# Patient Record
Sex: Female | Born: 1985 | Race: White | Hispanic: No | Marital: Married | Smoking: Never smoker
Health system: Southern US, Community
[De-identification: ages and names within clinical notes are randomized; demographics above are authoritative.]

## PROBLEM LIST (undated history)

## (undated) ENCOUNTER — Inpatient Hospital Stay (HOSPITAL_COMMUNITY): Payer: Self-pay

## (undated) DIAGNOSIS — R1010 Upper abdominal pain, unspecified: Secondary | ICD-10-CM

## (undated) DIAGNOSIS — Z9889 Other specified postprocedural states: Secondary | ICD-10-CM

## (undated) DIAGNOSIS — R112 Nausea with vomiting, unspecified: Secondary | ICD-10-CM

## (undated) DIAGNOSIS — M549 Dorsalgia, unspecified: Secondary | ICD-10-CM

## (undated) DIAGNOSIS — R1013 Epigastric pain: Secondary | ICD-10-CM

## (undated) DIAGNOSIS — K8020 Calculus of gallbladder without cholecystitis without obstruction: Secondary | ICD-10-CM

## (undated) HISTORY — PX: TONSILLECTOMY: SUR1361

---

## 2012-10-06 NOTE — L&D Delivery Note (Signed)
Delivery Note  First Stage: Labor onset: 0800 Augmentation : none Analgesia /Anesthesia intrapartum: Nubain 10 mg Sub-Q x 1 AROM at 1253  Second Stage: Complete dilation at 1459 Onset of pushing at 1500 FHR second stage 140 with few small variables Tight - thick hymenal band injected with xylocine 1% prior to crowning - clipped edge of hymenal band with crowning  Delivery of a viable female at 105 by CNM in direct OA position Loose nuchal cord x 1 - reduced over head at birth Cord double clamped after cessation of pulsation, cut by FOB Cord blood sample collected   Third Stage: Placenta delivered [facility] intact with 3 VC @ 1700 Placenta disposition: for disposal Uterine tone boggy - massage with minimal response - pitocin 10 units IM                        firm fundus but LUS remains boggy -clots expressed / Cytotec 800 mcg PR  - effective  Left wide superficial labial laceration identified  perineum intact Anesthesia for repair: 1% xylocaine local Repair 4-0 vicryl subcuticular repair to reapproximate labial tissues  Est. Blood Loss (mL): 400   Complications: none  Mom to postpartum.  Baby to Mom for bonding.  Newborn: Birth Weight: 8 pounds - 13 ounces Apgar Scores: 9-9 Feeding planned: breast  Marlinda Mike CNM, MSN, FACNM 05/27/2013, 5:21 PM

## 2012-10-25 LAB — OB RESULTS CONSOLE RUBELLA ANTIBODY, IGM: Rubella: IMMUNE

## 2012-10-25 LAB — OB RESULTS CONSOLE HEPATITIS B SURFACE ANTIGEN: Hepatitis B Surface Ag: NEGATIVE

## 2012-10-25 LAB — OB RESULTS CONSOLE RPR: RPR: NONREACTIVE

## 2012-10-25 LAB — OB RESULTS CONSOLE GC/CHLAMYDIA: Chlamydia: NEGATIVE

## 2012-11-29 ENCOUNTER — Inpatient Hospital Stay (HOSPITAL_COMMUNITY): Admission: AD | Admit: 2012-11-29 | Payer: Self-pay | Source: Ambulatory Visit | Admitting: Obstetrics and Gynecology

## 2013-05-27 ENCOUNTER — Encounter (HOSPITAL_COMMUNITY): Payer: Self-pay | Admitting: *Deleted

## 2013-05-27 ENCOUNTER — Inpatient Hospital Stay (HOSPITAL_COMMUNITY)
Admission: AD | Admit: 2013-05-27 | Discharge: 2013-05-29 | DRG: 775 | Disposition: A | Discharge status: Home / Self Care | Payer: 59 | Source: Ambulatory Visit | Attending: Obstetrics & Gynecology | Admitting: Obstetrics & Gynecology

## 2013-05-27 DIAGNOSIS — K649 Unspecified hemorrhoids: Secondary | ICD-10-CM | POA: Diagnosis present

## 2013-05-27 DIAGNOSIS — O878 Other venous complications in the puerperium: Principal | ICD-10-CM | POA: Diagnosis present

## 2013-05-27 DIAGNOSIS — N896 Tight hymenal ring: Secondary | ICD-10-CM | POA: Diagnosis present

## 2013-05-27 MED ORDER — SIMETHICONE 80 MG PO CHEW: 80.0000 mg | CHEWABLE_TABLET | ORAL | Status: DC | PRN

## 2013-05-27 MED ORDER — ERYTHROMYCIN 5 MG/GM OP OINT
TOPICAL_OINTMENT | OPHTHALMIC | Status: AC
  Filled 2013-05-27: qty 1

## 2013-05-27 MED ORDER — IBUPROFEN 600 MG PO TABS
600.0000 mg | ORAL_TABLET | Freq: Four times a day (QID) | ORAL | Status: DC | PRN
  Administered 2013-05-27: 600 mg via ORAL
  Filled 2013-05-27: qty 1

## 2013-05-27 MED ORDER — MISOPROSTOL 200 MCG PO TABS
800.0000 ug | ORAL_TABLET | Freq: Once | ORAL | Status: AC
  Administered 2013-05-27: 800 ug via RECTAL

## 2013-05-27 MED ORDER — LANOLIN HYDROUS EX OINT: TOPICAL_OINTMENT | CUTANEOUS | Status: DC | PRN

## 2013-05-27 MED ORDER — OXYTOCIN 10 UNIT/ML IJ SOLN
10.0000 [IU] | Freq: Once | INTRAMUSCULAR | Status: AC
  Administered 2013-05-27: 10 [IU] via INTRAMUSCULAR

## 2013-05-27 MED ORDER — NALBUPHINE SYRINGE 5 MG/0.5 ML
INJECTION | INTRAMUSCULAR | Status: AC
  Filled 2013-05-27: qty 1

## 2013-05-27 MED ORDER — OXYCODONE-ACETAMINOPHEN 5-325 MG PO TABS: 1.0000 | ORAL_TABLET | ORAL | Status: DC | PRN

## 2013-05-27 MED ORDER — NALBUPHINE HCL 10 MG/ML IJ SOLN
10.0000 mg | INTRAMUSCULAR | Status: DC | PRN
  Administered 2013-05-27: 10 mg via SUBCUTANEOUS
  Filled 2013-05-27: qty 1

## 2013-05-27 MED ORDER — IBUPROFEN 600 MG PO TABS
600.0000 mg | ORAL_TABLET | Freq: Four times a day (QID) | ORAL | Status: DC
  Administered 2013-05-27 – 2013-05-29 (×6): 600 mg via ORAL
  Filled 2013-05-27 (×6): qty 1

## 2013-05-27 MED ORDER — DIBUCAINE 1 % RE OINT: 1.0000 "application " | TOPICAL_OINTMENT | RECTAL | Status: DC | PRN

## 2013-05-27 MED ORDER — HYDROCORTISONE 2.5 % RE CREA
TOPICAL_CREAM | Freq: Three times a day (TID) | RECTAL | Status: DC
  Administered 2013-05-28 (×3): via RECTAL
  Filled 2013-05-27: qty 28.35

## 2013-05-27 MED ORDER — OXYTOCIN 10 UNIT/ML IJ SOLN
INTRAMUSCULAR | Status: AC
  Filled 2013-05-27: qty 2

## 2013-05-27 MED ORDER — WITCH HAZEL-GLYCERIN EX PADS: 1.0000 "application " | MEDICATED_PAD | CUTANEOUS | Status: DC | PRN

## 2013-05-27 MED ORDER — DIPHENHYDRAMINE HCL 25 MG PO CAPS: 25.0000 mg | ORAL_CAPSULE | Freq: Four times a day (QID) | ORAL | Status: DC | PRN

## 2013-05-27 MED ORDER — LACTATED RINGERS IV SOLN: 500.0000 mL | INTRAVENOUS | Status: DC | PRN

## 2013-05-27 MED ORDER — PANTOPRAZOLE SODIUM 20 MG PO TBEC
20.0000 mg | DELAYED_RELEASE_TABLET | Freq: Every day | ORAL | Status: DC
  Filled 2013-05-27 (×2): qty 1

## 2013-05-27 MED ORDER — CITRIC ACID-SODIUM CITRATE 334-500 MG/5ML PO SOLN: 30.0000 mL | ORAL | Status: DC | PRN

## 2013-05-27 MED ORDER — SENNOSIDES-DOCUSATE SODIUM 8.6-50 MG PO TABS
2.0000 | ORAL_TABLET | Freq: Every day | ORAL | Status: DC
  Administered 2013-05-27 – 2013-05-28 (×2): 2 via ORAL

## 2013-05-27 MED ORDER — MISOPROSTOL 200 MCG PO TABS
ORAL_TABLET | ORAL | Status: AC
  Filled 2013-05-27: qty 4

## 2013-05-27 MED ORDER — LIDOCAINE HCL (PF) 1 % IJ SOLN
INTRAMUSCULAR | Status: AC
  Filled 2013-05-27: qty 30

## 2013-05-27 MED ORDER — LIDOCAINE HCL (PF) 1 % IJ SOLN
30.0000 mL | INTRAMUSCULAR | Status: DC | PRN
  Administered 2013-05-27: 30 mL via SUBCUTANEOUS
  Filled 2013-05-27: qty 30

## 2013-05-27 MED ORDER — BENZOCAINE-MENTHOL 20-0.5 % EX AERO
1.0000 "application " | INHALATION_SPRAY | CUTANEOUS | Status: DC | PRN
  Administered 2013-05-27: 1 via TOPICAL
  Filled 2013-05-27: qty 56

## 2013-05-27 MED ORDER — ACETAMINOPHEN 325 MG PO TABS: 650.0000 mg | ORAL_TABLET | ORAL | Status: DC | PRN

## 2013-05-27 MED ORDER — PRENATAL MULTIVITAMIN CH
1.0000 | ORAL_TABLET | Freq: Every day | ORAL | Status: DC
  Filled 2013-05-27: qty 1

## 2013-05-27 NOTE — H&P (Signed)
OB ADMISSION/ HISTORY & PHYSICAL:  Admission Date: 05/27/2013 11:06 AM  Admit Diagnosis: Active Labor  Dana Anderson is a 27 y.o. female presenting for active labor.  Prenatal History: G1P0   EDC : 05/30/2013, by Other Basis  Prenatal care at [facility]  Primary Ob Provider: Marlinda Mike CNM Prenatal course uncomplicated  Prenatal Labs: ABO, Rh: O/Positive/-- (01/20 0000) Antibody: Negative (01/20 0000) Rubella: Immune (01/20 0000)  RPR: Nonreactive (01/20 0000)  HBsAg: Negative (01/20 0000)  HIV: Non-reactive (01/20 0000)  GTT: NL GBS: Negative (07/25 0000)   Medical / Surgical History :  Past medical history: History reviewed. No pertinent past medical history.   Past surgical history:  Past Surgical History  Procedure Laterality Date  . Tonsillectomy      Family History:  Family History  Problem Relation Age of Onset  . Diabetes Mother   . Diabetes Maternal Grandmother   . Diabetes Maternal Grandfather      Social History:  reports that she has never smoked. She does not have any smokeless tobacco history on file. She reports that she does not drink alcohol or use illicit drugs.  Allergies: Review of patient's allergies indicates no known allergies.   Current Medications at time of admission:  Prior to Admission medications   Medication Sig Start Date End Date Taking? Authorizing Provider  calcium carbonate (TUMS - DOSED IN MG ELEMENTAL CALCIUM) 500 MG chewable tablet Chew 1 tablet by mouth daily as needed for heartburn.   Yes Historical Provider, MD  pantoprazole (PROTONIX) 20 MG tablet Take 20 mg by mouth daily.   Yes Historical Provider, MD  Prenatal Vit-Fe Fumarate-FA (PRENATAL MULTIVITAMIN) TABS tablet Take 1 tablet by mouth daily at 12 noon.   Yes Historical Provider, MD    Review of Systems: Active FM onset of ctx @ 0800 currently every 2-3 minutes No LOF  / SROM bloody show present  Physical Exam:   General: alert and  oriented, appears uncomfortable with ctx but calm and relaxed  Heart: RRR Lungs: Clear lung fields Abdomen: Gravid, soft and non-tender, non-distended / uterus: gravid and non-tender Extremities: no edema  Genitalia / VE: 8-9 cm / 90% vtx / 0 station ROT / BBOW   FHR: baseline rate 130 / variability moderate / accelerations + / no decelerations TOCO: 2-4  Assessment: [redacted] weeks gestation active stage of labor FHR category 1   Plan:  Expectant management after AROM - planning natural childbirth Dr Seymour Bars notified of admission / plan of care   Marlinda Mike CNM, MSN, [facility] 05/27/2013, 1300PM

## 2013-05-27 NOTE — Progress Notes (Signed)
Pt firm but deviated to right. Assisted to bathroom and had large gush of blood when got up.  After voiding, fundus firm, still slightly to right but more midline, bleeding WNL. Pt felt slightly dizzy after being up for a while- assisted back to bed and instructed to call for assist when up next.

## 2013-05-27 NOTE — Progress Notes (Signed)
Patient ID: Dana Anderson, female   DOB: June 14, 1986, 27 y.o.   MRN: [id number]  S:  Contractions uncomfortable - tolerating labor well      Declines analgesia at this time - does not plan for epidural  O:  VS: Blood pressure 129/90, pulse 90, temperature 97.4 F (36.3 C), temperature source Oral, resp. rate 20, height 5\' 6"  (1.676 m), weight 76.114 kg (167 lb 12.8 oz).        FHR : baseline 135 / variability moderate / accelerations + / no decelerations        Toco: contractions every 2-4 minutes / moderate-strong        Cervix : 9 cm / 95% / vtx 0 / ROT        Membranes: AROM - large amount thin yellow MSF  A: active labor     FHR category 1  P: expectant management       Nubain 10 sub-q at patient request for pain   Marlinda Mike CNM, MSN, [facility] 05/27/2013, 1:05 PM

## 2013-05-28 LAB — COMPREHENSIVE METABOLIC PANEL
ALT: 19 U/L (ref 0–35)
AST: 68 U/L — ABNORMAL HIGH (ref 0–37)
Albumin: 2.2 g/dL — ABNORMAL LOW (ref 3.5–5.2)
Alkaline Phosphatase: 169 U/L — ABNORMAL HIGH (ref 39–117)
BUN: 12 mg/dL (ref 6–23)
CO2: 21 mEq/L (ref 19–32)
Calcium: 9 mg/dL (ref 8.4–10.5)
Chloride: 102 mEq/L (ref 96–112)
Creatinine, Ser: 0.76 mg/dL (ref 0.50–1.10)
GFR calc Af Amer: >90 mL/min (ref >90)
GFR calc non Af Amer: >90 mL/min (ref >90)
Glucose, Bld: 115 mg/dL — ABNORMAL HIGH (ref 70–99)
Potassium: 3.8 mEq/L (ref 3.5–5.1)
Sodium: 133 mEq/L — ABNORMAL LOW (ref 135–145)
Total Bilirubin: 0.2 mg/dL — ABNORMAL LOW (ref 0.3–1.2)
Total Protein: 5.2 g/dL — ABNORMAL LOW (ref 6.0–8.3)

## 2013-05-28 LAB — RPR: RPR Ser Ql: NONREACTIVE

## 2013-05-28 LAB — CBC
HCT: 29.5 % — ABNORMAL LOW (ref 36.0–46.0)
Hemoglobin: 10.1 g/dL — ABNORMAL LOW (ref 12.0–15.0)
MCH: 28.8 pg (ref 26.0–34.0)
MCHC: 34.2 g/dL (ref 30.0–36.0)
MCV: 84 fL (ref 78.0–100.0)
Platelets: 207 10*3/uL (ref 150–400)
RBC: 3.51 MIL/uL — ABNORMAL LOW (ref 3.87–5.11)
RDW: 13.7 % (ref 11.5–15.5)
WBC: 13.4 10*3/uL — ABNORMAL HIGH (ref 4.0–10.5)

## 2013-05-28 LAB — URIC ACID: Uric Acid, Serum: 5.4 mg/dL (ref 2.4–7.0)

## 2013-05-28 NOTE — Progress Notes (Signed)
PPD 1 SVD  S:  Reports feeling well - would like early DC this PM at 24 hours             Tolerating po/ No nausea or vomiting             Bleeding is moderate             Pain controlled with motrin             Up ad lib / ambulatory / voiding QS  Newborn breast feeding  / Circumcision planned today O:               VS: BP 108/73  Pulse 81  Temp(Src) 98.6 F (37 C) (Oral)  Resp 18  Ht 5\' 6"  (1.676 m)  Wt 76.114 kg (167 lb 12.8 oz)  BMI 27.1 kg/m2  SpO2 98%   LABS:              Recent Labs  05/28/13 0615  WBC 13.4*  HGB 10.1*  PLT 207               Blood type: O/Positive/-- (01/20 0000)  Rubella: Immune (01/20 0000)                     I&O: Intake/Output     08/22 0701 - 08/23 0700 08/23 0701 - 08/24 0700   Blood 400    Total Output 400     Net -400                        Physical Exam:             Alert and oriented X3  Abdomen: soft, non-tender, non-distended              Fundus: firm, non-tender, U-2  Perineum: moderate edema / ice pack in place  Lochia: light  Extremities: no edema, no calf pain or tenderness    A: PPD # 1             edema at perineum / rectal swelling from pushing and rectal dilation -                                                  recommend topical hemorrhoidal cream to reduce swelling and pain    Doing well - stable status             Newborn circ today  P: Routine post partum orders  Consider early dc tonight pending Peds decision to discharge newborn  Marlinda Mike CNM, MSN, [facility] 05/28/2013, 7:51 AM

## 2013-05-29 MED ORDER — IBUPROFEN 600 MG PO TABS: 600.0000 mg | ORAL_TABLET | Freq: Four times a day (QID) | ORAL | Status: DC

## 2013-05-29 MED ORDER — OXYCODONE-ACETAMINOPHEN 5-325 MG PO TABS: 1.0000 | ORAL_TABLET | ORAL | Status: DC | PRN

## 2013-05-29 NOTE — Progress Notes (Signed)
PPD 2 SVD  S:  Reports feeling well - ready to go home             Tolerating po/ No nausea or vomiting             Bleeding is light             Pain controlled with motrin and percocet             Up ad lib / ambulatory / voiding QS  Newborn breast feeding    O:               VS: BP 103/65  Pulse 67  Temp(Src) 97.8 F (36.6 C) (Oral)  Resp 18  Ht 5\' 6"  (1.676 m)  Wt 76.114 kg (167 lb 12.8 oz)  BMI 27.1 kg/m2  SpO2 98%   LABS:              Recent Labs  05/28/13 0615  WBC 13.4*  HGB 10.1*  PLT 207               Blood type: O/Positive/-- (01/20 0000)  Rubella: Immune (01/20 0000)                           Physical Exam:             Alert and oriented X3  Abdomen: soft, non-tender, non-distended              Fundus: firm, non-tender, U-2  Perineum: mild edema  Lochia: light  Extremities: no edema, no calf pain or tenderness    A: PPD # 2   Doing well - stable status  P: Routine post partum orders  DC home - [facility] booklet / instructions reviewed  Marlinda Mike CNM, MSN, [facility] 05/29/2013, 8:52 AM

## 2013-05-29 NOTE — Discharge Summary (Signed)
Obstetric Discharge Summary  Reason for Admission: onset of labor Prenatal Procedures: none Intrapartum Procedures: spontaneous vaginal delivery Postpartum Procedures: none Complications-Operative and Postpartum: none Hemoglobin  Date Value Range Status  05/28/2013 10.1* 12.0 - 15.0 g/dL Final     HCT  Date Value Range Status  05/28/2013 29.5* 36.0 - 46.0 % Final    Physical Exam:  General: alert, cooperative and no distress Lochia: appropriate Uterine Fundus: firm Perineum - intact - small left labial repair intact DVT Evaluation: No evidence of DVT seen on physical exam.  Discharge Diagnoses: Term Pregnancy-delivered  Discharge Information: Date: 05/29/2013 Activity: pelvic rest Diet: routine Medications: PNV, Ibuprofen, Colace and Percocet Condition: stable Instructions: refer to practice specific booklet Discharge to: home Follow-up Information   Follow up with Marlinda Mike, CNM. Schedule an appointment as soon as possible for a visit in 6 weeks.   Specialty:  Obstetrics and Gynecology   Contact information:   [address] [place] [address] [phone]       Newborn Data: Live born female  Birth Weight: 8 lb 13.3 oz (4006 g) APGAR: 9, 9  Home with mother.  Marlinda Mike 05/29/2013, 8:55 AM

## 2013-05-29 NOTE — Discharge Summary (Signed)
Baby: circ done w/o complication

## 2014-08-07 ENCOUNTER — Encounter (HOSPITAL_COMMUNITY): Payer: Self-pay | Admitting: *Deleted

## 2015-03-23 LAB — OB RESULTS CONSOLE ANTIBODY SCREEN: Antibody Screen: NEGATIVE

## 2015-03-23 LAB — OB RESULTS CONSOLE RUBELLA ANTIBODY, IGM: RUBELLA: IMMUNE

## 2015-03-23 LAB — OB RESULTS CONSOLE HEPATITIS B SURFACE ANTIGEN: Hepatitis B Surface Ag: NEGATIVE

## 2015-03-23 LAB — OB RESULTS CONSOLE HIV ANTIBODY (ROUTINE TESTING): HIV: NONREACTIVE

## 2015-03-23 LAB — OB RESULTS CONSOLE RPR: RPR: NONREACTIVE

## 2015-04-05 LAB — OB RESULTS CONSOLE GC/CHLAMYDIA
CHLAMYDIA, DNA PROBE: NEGATIVE
GC PROBE AMP, GENITAL: NEGATIVE

## 2015-05-01 ENCOUNTER — Encounter (HOSPITAL_COMMUNITY): Payer: Self-pay | Admitting: *Deleted

## 2015-05-01 ENCOUNTER — Inpatient Hospital Stay (HOSPITAL_COMMUNITY)
Admission: AD | Admit: 2015-05-01 | Discharge: 2015-05-01 | Disposition: A | Discharge status: Home / Self Care | Payer: BLUE CROSS/BLUE SHIELD | Source: Ambulatory Visit | Attending: Obstetrics & Gynecology | Admitting: Obstetrics & Gynecology

## 2015-05-01 ENCOUNTER — Inpatient Hospital Stay (HOSPITAL_COMMUNITY): Payer: BLUE CROSS/BLUE SHIELD

## 2015-05-01 DIAGNOSIS — O99611 Diseases of the digestive system complicating pregnancy, first trimester: Secondary | ICD-10-CM | POA: Diagnosis not present

## 2015-05-01 DIAGNOSIS — J069 Acute upper respiratory infection, unspecified: Secondary | ICD-10-CM | POA: Diagnosis not present

## 2015-05-01 DIAGNOSIS — R0602 Shortness of breath: Secondary | ICD-10-CM | POA: Diagnosis present

## 2015-05-01 DIAGNOSIS — Z3A13 13 weeks gestation of pregnancy: Secondary | ICD-10-CM | POA: Diagnosis not present

## 2015-05-01 DIAGNOSIS — K219 Gastro-esophageal reflux disease without esophagitis: Secondary | ICD-10-CM | POA: Diagnosis not present

## 2015-05-01 DIAGNOSIS — O99511 Diseases of the respiratory system complicating pregnancy, first trimester: Secondary | ICD-10-CM | POA: Diagnosis not present

## 2015-05-01 LAB — URINALYSIS, ROUTINE W REFLEX MICROSCOPIC
BILIRUBIN URINE: NEGATIVE
Glucose, UA: NEGATIVE mg/dL
Hgb urine dipstick: NEGATIVE
Ketones, ur: NEGATIVE mg/dL
Leukocytes, UA: NEGATIVE
NITRITE: NEGATIVE
Protein, ur: NEGATIVE mg/dL
SPECIFIC GRAVITY, URINE: 1.015 (ref 1.005–1.030)
UROBILINOGEN UA: 0.2 mg/dL (ref 0.0–1.0)
pH: 7 (ref 5.0–8.0)

## 2015-05-01 LAB — CBC WITH DIFFERENTIAL/PLATELET
BASOS ABS: 0 10*3/uL (ref 0.0–0.1)
Basophils Relative: 0 % (ref 0–1)
Eosinophils Absolute: 0 10*3/uL (ref 0.0–0.7)
Eosinophils Relative: 0 % (ref 0–5)
HCT: 32.6 % — ABNORMAL LOW (ref 36.0–46.0)
HEMOGLOBIN: 11.2 g/dL — AB (ref 12.0–15.0)
LYMPHS PCT: 7 % — AB (ref 12–46)
Lymphs Abs: 1 10*3/uL (ref 0.7–4.0)
MCH: 29.6 pg (ref 26.0–34.0)
MCHC: 34.4 g/dL (ref 30.0–36.0)
MCV: 86 fL (ref 78.0–100.0)
MONO ABS: 1 10*3/uL (ref 0.1–1.0)
MONOS PCT: 7 % (ref 3–12)
Neutro Abs: 12 10*3/uL — ABNORMAL HIGH (ref 1.7–7.7)
Neutrophils Relative %: 86 % — ABNORMAL HIGH (ref 43–77)
PLATELETS: 248 10*3/uL (ref 150–400)
RBC: 3.79 MIL/uL — ABNORMAL LOW (ref 3.87–5.11)
RDW: 14.3 % (ref 11.5–15.5)
WBC: 14 10*3/uL — ABNORMAL HIGH (ref 4.0–10.5)

## 2015-05-01 MED ORDER — AZITHROMYCIN 250 MG PO TABS: ORAL_TABLET | ORAL | Status: AC

## 2015-05-01 NOTE — MAU Note (Signed)
Fever of 100.2 around 0730. Took some tylenol at 0630.Reports SHOB starting on Friday. Pressure on both sides in ribs on Friday that she says is not as bad now, but still having some difficulty. Migraine HA started during the night and rates pain 6of10 now.

## 2015-05-01 NOTE — MAU Note (Signed)
Feer of 100.2 around 0730.  Started feeling short of breath on Friday, both sides of her ribs.states is not as bad now, but still having some difficulty. (able to talk without pause, closed mouth breathing when not talking).  Took some tylenol at 0630.  Started getting a HA during the night, ? Migraine.

## 2015-05-01 NOTE — Discharge Instructions (Signed)
Upper Respiratory Infection, Adult An upper respiratory infection (URI) is also sometimes known as the common cold. The upper respiratory tract includes the nose, sinuses, throat, trachea, and bronchi. Bronchi are the airways leading to the lungs. Most people improve within 1 week, but symptoms can last up to 2 weeks. A residual cough may last even longer.  CAUSES Many different viruses can infect the tissues lining the upper respiratory tract. The tissues become irritated and inflamed and often become very moist. Mucus production is also common. A cold is contagious. You can easily spread the virus to others by oral contact. This includes kissing, sharing a glass, coughing, or sneezing. Touching your mouth or nose and then touching a surface, which is then touched by another person, can also spread the virus. SYMPTOMS  Symptoms typically develop 1 to 3 days after you come in contact with a cold virus. Symptoms vary from person to person. They may include:  Runny nose.  Sneezing.  Nasal congestion.  Sinus irritation.  Sore throat.  Loss of voice (laryngitis).  Cough.  Fatigue.  Muscle aches.  Loss of appetite.  Headache.  Low-grade fever. DIAGNOSIS  You might diagnose your own cold based on familiar symptoms, since most people get a cold 2 to 3 times a year. Your caregiver can confirm this based on your exam. Most importantly, your caregiver can check that your symptoms are not due to another disease such as strep throat, sinusitis, pneumonia, asthma, or epiglottitis. Blood tests, throat tests, and X-rays are not necessary to diagnose a common cold, but they may sometimes be helpful in excluding other more serious diseases. Your caregiver will decide if any further tests are required. RISKS AND COMPLICATIONS  You may be at risk for a more severe case of the common cold if you smoke cigarettes, have chronic heart disease (such as heart failure) or lung disease (such as asthma), or if  you have a weakened immune system. The very young and very old are also at risk for more serious infections. Bacterial sinusitis, middle ear infections, and bacterial pneumonia can complicate the common cold. The common cold can worsen asthma and chronic obstructive pulmonary disease (COPD). Sometimes, these complications can require emergency medical care and may be life-threatening. PREVENTION  The best way to protect against getting a cold is to practice good hygiene. Avoid oral or hand contact with people with cold symptoms. Wash your hands often if contact occurs. There is no clear evidence that vitamin C, vitamin E, echinacea, or exercise reduces the chance of developing a cold. However, it is always recommended to get plenty of rest and practice good nutrition. TREATMENT  Treatment is directed at relieving symptoms. There is no cure. Antibiotics are not effective, because the infection is caused by a virus, not by bacteria. Treatment may include:  Increased fluid intake. Sports drinks offer valuable electrolytes, sugars, and fluids.  Breathing heated mist or steam (vaporizer or shower).  Eating chicken soup or other clear broths, and maintaining good nutrition.  Getting plenty of rest.  Using gargles or lozenges for comfort.  Controlling fevers with ibuprofen or acetaminophen as directed by your caregiver.  Increasing usage of your inhaler if you have asthma. Zinc gel and zinc lozenges, taken in the first 24 hours of the common cold, can shorten the duration and lessen the severity of symptoms. Pain medicines may help with fever, muscle aches, and throat pain. A variety of non-prescription medicines are available to treat congestion and runny nose. Your caregiver   can make recommendations and may suggest nasal or lung inhalers for other symptoms.  HOME CARE INSTRUCTIONS   Only take over-the-counter or prescription medicines for pain, discomfort, or fever as directed by your  caregiver.  Use a warm mist humidifier or inhale steam from a shower to increase air moisture. This may keep secretions moist and make it easier to breathe.  Drink enough water and fluids to keep your urine clear or pale yellow.  Rest as needed.  Return to work when your temperature has returned to normal or as your caregiver advises. You may need to stay home longer to avoid infecting others. You can also use a face mask and careful hand washing to prevent spread of the virus. SEEK MEDICAL CARE IF:   After the first few days, you feel you are getting worse rather than better.  You need your caregiver's advice about medicines to control symptoms.  You develop chills, worsening shortness of breath, or brown or red sputum. These may be signs of pneumonia.  You develop yellow or brown nasal discharge or pain in the face, especially when you bend forward. These may be signs of sinusitis.  You develop a fever, swollen neck glands, pain with swallowing, or white areas in the back of your throat. These may be signs of strep throat. SEEK IMMEDIATE MEDICAL CARE IF:   You have a fever.  You develop severe or persistent headache, ear pain, sinus pain, or chest pain.  You develop wheezing, a prolonged cough, cough up blood, or have a change in your usual mucus (if you have chronic lung disease).  You develop sore muscles or a stiff neck. Document Released: 03/18/2001 Document Revised: 12/15/2011 Document Reviewed: 12/28/2013 ExitCare Patient Information 2015 [employer]. This information is not intended to replace advice given to you by your health care provider. Make sure you discuss any questions you have with your health care provider.  

## 2015-05-01 NOTE — MAU Provider Note (Signed)
History     CSN: [id number]  Arrival date and time: 05/01/15 4098   None     Chief Complaint  Patient presents with  . Fever  . Shortness of Breath   HPI Comments: G2P1001 @13 .6 wks c/o SOB that started 4 days ago along with low grade temp and malaise. Reports that SOB subsided the next day then returned at 0100 this am along with HA and temp 100.2. Denies cough or nasal drainage. Spouse had similar sx which resolved and child had cold sx about 1 wk ago. Denies urinary sx. No cramping or VB. Pregnancy complicated by GERD, mild N/V, and LGSIL and recent colpo w/bx.    OB History    Gravida Para Term Preterm AB TAB SAB Ectopic Multiple Living   2 1 1       1       History reviewed. No pertinent past medical history.  Past Surgical History  Procedure Laterality Date  . Tonsillectomy      Family History  Problem Relation Age of Onset  . Diabetes Mother   . Diabetes Maternal Grandmother   . Diabetes Maternal Grandfather     History  Substance Use Topics  . Smoking status: Never Smoker   . Smokeless tobacco: Never Used  . Alcohol Use: Yes     Comment: socially only when not pregnant     Allergies: No Known Allergies  Prescriptions prior to admission  Medication Sig Dispense Refill Last Dose  . acetaminophen (TYLENOL) 325 MG tablet Take 650 mg by mouth every 6 (six) hours as needed.   05/01/2015 at Unknown time  . docusate sodium (COLACE) 100 MG capsule Take 100 mg by mouth daily as needed for mild constipation.   Past Week at Unknown time  . folic acid (FOLVITE) 1 MG tablet Take 1 mg by mouth daily.   04/30/2015 at Unknown time  . Meclizine HCl (BONINE PO) Take 1 tablet by mouth daily as needed (nausea).   Past Week at Unknown time  . Prenatal Vit-Fe Fumarate-FA (PRENATAL MULTIVITAMIN) TABS tablet Take 1 tablet by mouth daily at 12 noon.   04/30/2015 at Unknown time  . pyridOXINE (VITAMIN B-6) 100 MG tablet Take 100 mg by mouth daily.   05/01/2015 at Unknown time  .  ranitidine (ZANTAC) 300 MG tablet Take 300 mg by mouth 2 (two) times daily.   04/30/2015 at Unknown time  . Simethicone (GAS-X PO) Take 2 tablets by mouth daily as needed (for gas x).   Past Week at Unknown time  . [DISCONTINUED] ibuprofen (ADVIL,MOTRIN) 600 MG tablet Take 1 tablet (600 mg total) by mouth every 6 (six) hours. (Patient not taking: Reported on 05/01/2015) 30 tablet 0 Not Taking at Unknown time  . [DISCONTINUED] oxyCODONE-acetaminophen (PERCOCET/ROXICET) 5-325 MG per tablet Take 1 tablet by mouth every 4 (four) hours as needed. (Patient not taking: Reported on 05/01/2015) 15 tablet 0 Not Taking at Unknown time    Review of Systems  Constitutional: Positive for fever and malaise/fatigue.  Eyes: Negative.   Respiratory: Positive for shortness of breath.   Cardiovascular: Negative.   Gastrointestinal: Positive for nausea and vomiting.  Genitourinary: Negative.   Musculoskeletal: Negative.   Skin: Negative.   Neurological: Positive for headaches.  Endo/Heme/Allergies: Negative.   Psychiatric/Behavioral: Negative.    Physical Exam   Blood pressure 121/75, pulse 102, temperature 98.5 F (36.9 C), temperature source Oral, resp. rate 16, SpO2 98 %, unknown if currently breastfeeding.  Physical Exam  Constitutional: She is  oriented to person, place, and time. She appears well-developed and well-nourished.  HENT:  Head: Normocephalic and atraumatic.  Neck: Normal range of motion. Neck supple.  Cardiovascular: Regular rhythm.   tachy  Respiratory: Effort normal and breath sounds normal.  CVAT neg  GI: Soft. Bowel sounds are normal.  Genitourinary:  deferred  Musculoskeletal: Normal range of motion.  Neurological: She is alert and oriented to person, place, and time.  Skin: Skin is warm and dry.  Psychiatric: She has a normal mood and affect.  FHT-155 bpm  CLINICAL DATA: Thirteen weeks pregnant. Fever and shortness of breath.  EXAM: CHEST 2 VIEW  COMPARISON:  None.  FINDINGS: The heart size and mediastinal contours are within normal limits. Both lungs are clear. The visualized skeletal structures are unremarkable.  IMPRESSION: No active cardiopulmonary disease.  Results for HONORE, WIPPERFURTH (MRN [id number]) as of 05/01/2015 11:12  Ref. Range 05/01/2015 10:30  WBC Latest Ref Range: 4.0-10.5 K/uL 14.0 (H)  RBC Latest Ref Range: 3.87-5.11 MIL/uL 3.79 (L)  Hemoglobin Latest Ref Range: 12.0-15.0 g/dL 40.9 (L)  HCT Latest Ref Range: 36.0-46.0 % 32.6 (L)  MCV Latest Ref Range: 78.0-100.0 fL 86.0  MCH Latest Ref Range: 26.0-34.0 pg 29.6  MCHC Latest Ref Range: 30.0-36.0 g/dL 81.1  RDW Latest Ref Range: 11.5-15.5 % 14.3  Platelets Latest Ref Range: 150-400 K/uL 248  Neutrophils Latest Ref Range: 43-77 % 86 (H)  Lymphocytes Latest Ref Range: 12-46 % 7 (L)  Monocytes Relative Latest Ref Range: 3-12 % 7  Eosinophil Latest Ref Range: 0-5 % 0  Basophil Latest Ref Range: 0-1 % 0  NEUT# Latest Ref Range: 1.7-7.7 K/uL 12.0 (H)  Lymphocyte # Latest Ref Range: 0.7-4.0 K/uL 1.0  Monocyte # Latest Ref Range: 0.1-1.0 K/uL 1.0  Eosinophils Absolute Latest Ref Range: 0.0-0.7 K/uL 0.0  Basophils Absolute Latest Ref Range: 0.0-0.1 K/uL 0.0   MAU Course  Procedures CBCd CXR  Assessment and Plan  13.[redacted] weeks gestation Upper respiratory infection  Discharge home Z-pak-take as directed Tylenol  po q6 hrs prn fever or HA Maintain good hydration Rest Call if sx not improved in 3-4 days Follow-up at [facility] in 3 days  Consult with Dr. Juliene Pina on A/P, agrees   [facility], Shawna Orleans, N 05/01/2015, 10:12 AM

## 2015-10-07 NOTE — L&D Delivery Note (Signed)
Delivery Note  First Stage: Labor onset: 2100 Augmentation : 2035 Analgesia Eliezer Lofts intrapartum: Nubain subQ x1 SROM at 2035  Second Stage: Complete dilation at 2352 Onset of pushing at 2355 FHR second stage category 2  Delivery of a viable female at 86 by CNM in ROA position Loose nuchal cord x 3 with tight final 4th wrap -baby delivered and somersaulted out of first wrap then cord unwound off neck Cord double clamped after cessation of pulsation, cut by FOB Cord blood sample collected   Third Stage: Placenta delivered [facility] intact with 3 VC @ 0008 Placenta disposition: hospital disposal Uterine tone firm / bleeding moderate amount with placental separation then small  no laceration identified  Est. Blood Loss (mL): 200  Complications: none  Mom to postpartum.  Baby to Couplet care / Skin to Skin.   Marlinda Mike CNM, MSN, FACNM 10/26/2015, 12:12 AM

## 2015-10-10 LAB — OB RESULTS CONSOLE GBS: GBS: NEGATIVE

## 2015-10-13 ENCOUNTER — Inpatient Hospital Stay (HOSPITAL_COMMUNITY)
Admission: AD | Admit: 2015-10-13 | Discharge: 2015-10-13 | Disposition: A | Discharge status: Home / Self Care | Payer: BLUE CROSS/BLUE SHIELD | Source: Ambulatory Visit | Attending: Obstetrics & Gynecology | Admitting: Obstetrics & Gynecology

## 2015-10-13 ENCOUNTER — Encounter (HOSPITAL_COMMUNITY): Payer: Self-pay | Admitting: *Deleted

## 2015-10-13 DIAGNOSIS — Z3493 Encounter for supervision of normal pregnancy, unspecified, third trimester: Secondary | ICD-10-CM | POA: Diagnosis present

## 2015-10-13 NOTE — Progress Notes (Signed)
Wiliam Ke bailey, CNM notified of patient, cervical exam, contractions and FHT.  Orders for pt to d/c home increase PO hydration, warm tub. Return if symptoms worsen

## 2015-10-13 NOTE — MAU Note (Signed)
Pt c/o contractions since 430pm. Around 730p, states that they got much more intense and about 3-4 mins apart. Denies LOF and vag bleeding. +FM. Was 2cm on Wed.

## 2015-10-24 ENCOUNTER — Inpatient Hospital Stay (HOSPITAL_COMMUNITY): Admission: AD | Admit: 2015-10-24 | Payer: BLUE CROSS/BLUE SHIELD | Source: Home / Self Care

## 2015-10-25 ENCOUNTER — Encounter (HOSPITAL_COMMUNITY): Payer: Self-pay

## 2015-10-25 ENCOUNTER — Inpatient Hospital Stay (HOSPITAL_COMMUNITY)
Admission: AD | Admit: 2015-10-25 | Discharge: 2015-10-27 | DRG: 775 | Disposition: A | Discharge status: Home / Self Care | Payer: BLUE CROSS/BLUE SHIELD | Source: Ambulatory Visit | Attending: Obstetrics and Gynecology | Admitting: Obstetrics and Gynecology

## 2015-10-25 DIAGNOSIS — Z3A39 39 weeks gestation of pregnancy: Secondary | ICD-10-CM | POA: Diagnosis not present

## 2015-10-25 DIAGNOSIS — Z833 Family history of diabetes mellitus: Secondary | ICD-10-CM

## 2015-10-25 MED ORDER — OXYCODONE-ACETAMINOPHEN 5-325 MG PO TABS: 1.0000 | ORAL_TABLET | ORAL | Status: DC | PRN

## 2015-10-25 MED ORDER — ACETAMINOPHEN 325 MG PO TABS: 650.0000 mg | ORAL_TABLET | ORAL | Status: DC | PRN

## 2015-10-25 MED ORDER — NALBUPHINE HCL 10 MG/ML IJ SOLN
10.0000 mg | INTRAMUSCULAR | Status: DC | PRN
  Administered 2015-10-25: 10 mg via SUBCUTANEOUS
  Filled 2015-10-25: qty 1

## 2015-10-25 MED ORDER — OXYCODONE-ACETAMINOPHEN 5-325 MG PO TABS: 2.0000 | ORAL_TABLET | ORAL | Status: DC | PRN

## 2015-10-25 MED ORDER — OXYTOCIN 10 UNIT/ML IJ SOLN
10.0000 [IU] | Freq: Once | INTRAMUSCULAR | Status: AC
  Administered 2015-10-26: 10 [IU] via INTRAMUSCULAR
  Filled 2015-10-25: qty 1

## 2015-10-25 MED ORDER — LACTATED RINGERS IV SOLN: 500.0000 mL | INTRAVENOUS | Status: DC | PRN

## 2015-10-25 MED ORDER — LIDOCAINE HCL (PF) 1 % IJ SOLN
30.0000 mL | INTRAMUSCULAR | Status: DC | PRN
  Filled 2015-10-25: qty 30

## 2015-10-25 MED ORDER — CITRIC ACID-SODIUM CITRATE 334-500 MG/5ML PO SOLN: 30.0000 mL | ORAL | Status: DC | PRN

## 2015-10-25 NOTE — MAU Note (Signed)
Patient presents with PROM at 2045.

## 2015-10-25 NOTE — MAU Note (Signed)
Notified provider that patient came in for PROM at 2045. Contracting ever 3-4 mins and has just started feeling the contractions. Provider said she would be down to see the patient shortly.

## 2015-10-25 NOTE — H&P (Signed)
  OB ADMISSION/ HISTORY & PHYSICAL:  Admission Date: 10/25/2015  8:52 PM  Admit Diagnosis: 39.1 weeks / active labor with SROM  Dana Anderson is a 30 y.o. female presenting for onset of labor.  Prenatal History: G2P1001   EDC : 10/31/2015, Date entered prior to episode creation  Prenatal care at [facility]  Primary Ob Provider: Fredric Mare CNM Prenatal course uncomplicated  Prenatal Labs: ABO, Rh:  O positive Antibody:  negative Rubella:   immune RPR:   NR HBsAg:   negative HIV:   NR GTT: normal GBS:   negative  Medical / Surgical History :  Past medical history: History reviewed. No pertinent past medical history.   Past surgical history:  Past Surgical History  Procedure Laterality Date  . Tonsillectomy      Family History:  Family History  Problem Relation Age of Onset  . Diabetes Mother   . Diabetes Maternal Grandmother   . Diabetes Maternal Grandfather      Social History:  reports that she has never smoked. She has never used smokeless tobacco. She reports that she drinks alcohol. She reports that she does not use illicit drugs.   Allergies: Review of patient's allergies indicates no known allergies.    Current Medications at time of admission:  Prior to Admission medications   Medication Sig Start Date End Date Taking? Authorizing Provider  docusate sodium (COLACE) 100 MG capsule Take 100 mg by mouth daily as needed for mild constipation.   Yes Historical Provider, MD  folic acid (FOLVITE) 1 MG tablet Take 1 mg by mouth daily.   Yes Historical Provider, MD  hydrocortisone cream 1 % Apply 1 application topically daily as needed for itching.   Yes Historical Provider, MD  pantoprazole (PROTONIX) 40 MG tablet Take 40 mg by mouth daily.   Yes Historical Provider, MD  Prenatal Vit-Fe Fumarate-FA (PRENATAL MULTIVITAMIN) TABS tablet Take 1 tablet by mouth daily at 12 noon.   Yes Historical Provider, MD  acetaminophen (TYLENOL) 325 MG tablet  Take 650 mg by mouth every 6 (six) hours as needed. Reported on 10/25/2015    Historical Provider, MD  Simethicone (GAS-X PO) Take 2 tablets by mouth daily as needed (for gas x). Reported on 10/25/2015    Historical Provider, MD   Review of Systems: Active FM onset of ctx @ 2100 currently every 3 minutes LOF  / SROM @ 2000 bloody show absent  Physical Exam:  VS: Blood pressure 130/90, pulse 94, temperature 97.5 F (36.4 C), temperature source Oral, resp. rate 16, unknown if currently breastfeeding.  General: alert and oriented, appears uncomfortable Heart: RRR Lungs: Clear lung fields Abdomen: Gravid, soft and non-tender, non-distended / uterus: gravid Extremities: no edema  Genitalia / VE: Dilation: 5 Exam by:: Marlinda Mike CNM   FHR: baseline rate 130 / variability moderate / accelerations + / no decelerations TOCO: Q 3-24minutes  Assessment: [redacted] weeks gestation active stage of labor FHR category 1   Plan:  Admit Expectant management Anticipate accelerated active phase  Dr Billy Coast notified of admission    Marlinda Mike CNM, MSN, [facility] 10/25/2015, 9:37 PM

## 2015-10-25 NOTE — Progress Notes (Signed)
S:  More pressure / ctx intense       Wants pain med now  O:  VS: Blood pressure 118/83, pulse 90, temperature 98 F (36.7 C), temperature source Oral, resp. rate 18, height  (1.702 m), weight 74.844 kg (165 lb), unknown if currently breastfeeding.        FHR : baseline 125 / variability moderate / accelerations + / few variable decelerations        Toco: contractions every 2 minutes / strong         Cervix : 8cm / 90% / vtx +1        Membranes: clear fluid  A: active labor     FHR category 2  P: expectant management     Nubain 10sub q now     Marlinda Mike CNM, MSN, [facility] 10/25/2015, 11:08 PM

## 2015-10-26 ENCOUNTER — Encounter (HOSPITAL_COMMUNITY): Payer: Self-pay | Admitting: *Deleted

## 2015-10-26 MED ORDER — LANOLIN HYDROUS EX OINT: TOPICAL_OINTMENT | CUTANEOUS | Status: DC | PRN

## 2015-10-26 MED ORDER — IBUPROFEN 600 MG PO TABS
600.0000 mg | ORAL_TABLET | Freq: Four times a day (QID) | ORAL | Status: DC
  Administered 2015-10-26 – 2015-10-27 (×5): 600 mg via ORAL
  Filled 2015-10-26 (×6): qty 1

## 2015-10-26 MED ORDER — BENZOCAINE-MENTHOL 20-0.5 % EX AERO: 1.0000 "application " | INHALATION_SPRAY | CUTANEOUS | Status: DC | PRN

## 2015-10-26 MED ORDER — WITCH HAZEL-GLYCERIN EX PADS: 1.0000 "application " | MEDICATED_PAD | CUTANEOUS | Status: DC | PRN

## 2015-10-26 MED ORDER — ACETAMINOPHEN 325 MG PO TABS: 650.0000 mg | ORAL_TABLET | ORAL | Status: DC | PRN

## 2015-10-26 MED ORDER — SIMETHICONE 80 MG PO CHEW: 80.0000 mg | CHEWABLE_TABLET | ORAL | Status: DC | PRN

## 2015-10-26 MED ORDER — DIBUCAINE 1 % RE OINT: 1.0000 "application " | TOPICAL_OINTMENT | RECTAL | Status: DC | PRN

## 2015-10-26 MED ORDER — SENNOSIDES-DOCUSATE SODIUM 8.6-50 MG PO TABS
2.0000 | ORAL_TABLET | ORAL | Status: DC
  Administered 2015-10-27: 2 via ORAL
  Filled 2015-10-26: qty 2

## 2015-10-26 MED ORDER — DIPHENHYDRAMINE HCL 25 MG PO CAPS: 25.0000 mg | ORAL_CAPSULE | Freq: Four times a day (QID) | ORAL | Status: DC | PRN

## 2015-10-26 NOTE — Progress Notes (Signed)
Interval Note:  S: Feels well. Pain controlled with Motrin.  O: VSS  A: S/p SVD  P: PPD # 0     Continue routine postpartum care.     Desires early discharge tomorrow. 

## 2015-10-26 NOTE — Lactation Note (Signed)
This note was copied from the chart of Dana Sharran Caratachea. Lactation Consultation Note  Mother holding baby STS with visitors in room. Mother states she has viewed colostrum when hand expressing and denies problems w/ latching. Mother asked a question about frozen breastmilk w/ 1st child.  Baby would not take her frozen breastmilk, only fresh. Provided information about checking freezer temp, checking to make sure odors were not being absorbed and  then lipase/scalding. Discussed cluster feeding.  Mom encouraged to feed baby 8-12 times/24 hours and with feeding cues.  Mom made aware of O/P services, breastfeeding support groups, community resources, and our phone # for post-discharge questions.    Patient Name: Dana Anderson [id number] Date: 10/26/2015 Reason for consult: Initial assessment   Maternal Data Has patient been taught Hand Expression?: Yes (states she has and viewed drops) Does the patient have breastfeeding experience prior to this delivery?: Yes  Feeding Feeding Type: Breast Fed Length of feed: 10 min  LATCH Score/Interventions                      Lactation Tools Discussed/Used     Consult Status Consult Status: Follow-up Date: 10/27/15 Follow-up type: In-patient    Dahlia Byes [facility] 10/26/2015, 11:30 AM

## 2015-10-27 MED ORDER — IBUPROFEN 600 MG PO TABS: 600.0000 mg | ORAL_TABLET | Freq: Four times a day (QID) | ORAL | Status: DC

## 2015-10-27 NOTE — Progress Notes (Signed)
Patient ID: Dana Anderson, female   DOB: [birth year], 30 y.o.   MRN: [id number] PPD # 1 SVD  S:  Reports feeling well. Desires early discharge home today.             Tolerating po/ No nausea or vomiting             Bleeding is light             Pain controlled with ibuprofen (OTC)             Up ad lib / ambulatory / voiding without difficulties    Newborn  Information for the patient's newborn:  [name], Downen [[id number]]  female  breast feeding    O:  A & O x 3, in no apparent distress              VS:  Filed Vitals:   10/26/15 0325 10/26/15 0900 10/26/15 1530 10/27/15 0650  BP: 109/66 104/70 106/67 104/67  Pulse: 55 66 66 54  Temp: 97.7 F (36.5 C) 97.6 F (36.4 C) 98.7 F (37.1 C) 98.5 F (36.9 C)  TempSrc: Oral Oral Oral Oral  Resp: Height:      Weight:      SpO2:  97% 98%     LABS: No results for input(s): WBC, HGB, HCT, PLT in the last 72 hours.  Blood type:  O Positive  Rubella: Immune (06/17 0000)   I&O: I/O last 3 completed shifts: In: -  Out: 500 [Urine:300; Blood:200]             Lungs: Clear and unlabored  Heart: regular rate and rhythm / no murmurs  Abdomen: soft, non-tender, non-distended             Fundus: firm, non-tender, U-2  Perineum: intact, no edema  Lochia: minimal  Extremities: No edema, no calf pain or tenderness, No Homans    A/P: PPD # 1  30 y.o., [id number]   Principal Problem:    Postpartum care following vaginal delivery (1/20)    Doing well - stable status  Routine post partum orders  Desires early D/C home today    Arita Miss, Dayna Geurts, M, MSN, CNM 10/27/2015, 8:18 AM

## 2015-10-27 NOTE — Discharge Summary (Signed)
OB Discharge Summary     Patient Name: Dana Anderson DOB: 1986-05-13 MRN: [id number]  Date of admission: 10/25/2015 Delivering MD: Marlinda Mike   Date of discharge: 10/27/2015  Admitting diagnosis: 39WKS WATER BROKE  Intrauterine pregnancy: [redacted]w[redacted]d     Secondary diagnosis:  Principal Problem:   Postpartum care following vaginal delivery (1/20) Active Problems:   Normal labor  Additional problems: none     Discharge diagnosis: Term Pregnancy Delivered                                                                                                Post partum procedures:none  Augmentation: none  Complications: None  Hospital course:  Onset of Labor With Vaginal Delivery     30 y.o. yo G2P2002 at [redacted]w[redacted]d was admitted in Active Labor on 10/25/2015. Patient had an uncomplicated labor course as follows:  Membrane Rupture Time/Date: 8:35 PM ,10/25/2015   Intrapartum Procedures: Episiotomy: None [1]                                         Lacerations:  None [1]  Patient had a delivery of a Viable infant. 10/26/2015  Information for the patient's newborn:  Nevae, Pinnix [[id number]]  Delivery Method: Vaginal, Spontaneous Delivery (Filed from Delivery Summary)    Pateint had an uncomplicated postpartum course.  She is ambulating, tolerating a regular diet, passing flatus, and urinating well. Patient is discharged home in stable condition on 10/27/2015.    Physical exam  Filed Vitals:   10/26/15 0325 10/26/15 0900 10/26/15 1530 10/27/15 0650  BP: 109/66 104/70 106/67 104/67  Pulse: 55 66 66 54  Temp: 97.7 F (36.5 C) 97.6 F (36.4 C) 98.7 F (37.1 C) 98.5 F (36.9 C)  TempSrc: Oral Oral Oral Oral  Resp: Height:      Weight:      SpO2:  97% 98%    General: alert, cooperative and no distress Lochia: appropriate Uterine Fundus: firm, midline, U-2 Perineum: intact DVT Evaluation: No evidence of DVT seen on physical exam. Negative Homan's  sign. No cords or calf tenderness. No significant calf/ankle edema. Labs: Lab Results  Component Value Date   WBC 14.0* 05/01/2015   HGB 11.2* 05/01/2015   HCT 32.6* 05/01/2015   MCV 86.0 05/01/2015   PLT 248 05/01/2015   CMP Latest Ref Rng 05/28/2013  Glucose 70 - 99 mg/dL 782(N)  BUN 6 - 23 mg/dL 12  Creatinine 5.62 - 1.30 mg/dL 8.65  Sodium 784 - 696 mEq/L 133(L)  Potassium 3.5 - 5.1 mEq/L 3.8  Chloride 96 - 112 mEq/L 102  CO2 19 - 32 mEq/L 21  Calcium 8.4 - 10.5 mg/dL 9.0  Total Protein 6.0 - 8.3 g/dL 5.2(L)  Total Bilirubin 0.3 - 1.2 mg/dL 2.9(B)  Alkaline Phos 39 - 117 U/L 169(H)  AST 0 - 37 U/L 68(H)  ALT 0 - 35 U/L 19    Discharge instruction: per After Visit Summary and "Baby  and Me Booklet".  After visit meds:    Medication List    STOP taking these medications        folic acid 1 MG tablet  Commonly known as:  FOLVITE     GAS-X PO     pantoprazole 40 MG tablet  Commonly known as:  PROTONIX      TAKE these medications        acetaminophen 325 MG tablet  Commonly known as:  TYLENOL  Take 650 mg by mouth every 6 (six) hours as needed. Reported on 10/25/2015     docusate sodium 100 MG capsule  Commonly known as:  COLACE  Take 100 mg by mouth daily as needed for mild constipation.     hydrocortisone cream 1 %  Apply 1 application topically daily as needed for itching.     ibuprofen 600 MG tablet  Commonly known as:  ADVIL,MOTRIN  Take 1 tablet (600 mg total) by mouth every 6 (six) hours.     prenatal multivitamin Tabs tablet  Take 1 tablet by mouth daily at 12 noon.        Diet: routine diet  Activity: Advance as tolerated. Pelvic rest for 6 weeks.   Outpatient follow up:6 weeks Follow up Appt:No future appointments. Follow up Visit:No Follow-up on file.  Postpartum contraception: Abstinence  Newborn Data: Live born female on 10/26/2015 Birth Weight: 7 lb 3.7 oz (3280 g) APGAR: 9, 9  Baby Feeding: Breast Disposition:home with  mother   10/27/2015 Raelyn Mora, Judie Petit, CNM

## 2015-10-27 NOTE — Discharge Instructions (Signed)
Breast Pumping Tips If you are breastfeeding, there may be times when you cannot feed your baby directly. Returning to work or going on a trip are examples. Pumping allows you to store breast milk and feed it to your baby later.  You may not get much milk when you first start to pump. Your breasts should start to make more after a few days. If you pump at the times you usually feed your baby, you may be able to keep making enough milk to feed your baby without also using formula. The more often you pump, the more milk your body will make. WHEN SHOULD I PUMP?   You can start to pump soon after you have your baby. Ask your doctor what is right for you and your baby.  If you are going back to work, start pumping a few weeks before. This gives you time to learn how to pump and to store a supply of milk.  When you are with your baby, feed your baby when he or she is hungry. Pump after each feeding.  When you are away from your baby for many hours, pump for about 15 minutes every 2-3 hours. Pump both breasts at the same time if you can.  If your baby has a formula feeding, make sure to pump close to the same time.  If you drink any alcohol, wait 2 hours before pumping. HOW DO I GET READY TO PUMP? Your let-down reflex is your body's natural reaction that makes your breast milk flow. It is easier to make your breast milk flow when you are relaxed. Try these things to help you relax:  Smell one of your baby's blankets or an item of clothing.  Look at a picture or video of your baby.  Sit in a quiet, private space.  Massage the breast you plan to pump.  Place soothing warmth on the breast.  Play relaxing music. WHAT ARE SOME BREAST PUMPING TIPS?  Wash your hands before you pump. You do not need to wash your nipples or breasts.  There are three ways to pump. You can:  Use your hand to massage and squeeze your breast.  Use a handheld manual pump.  Use an electric pump.  Make sure the  suction cup on the breast pump is the right size. Place the suction cup directly over the nipple. It can be painful or hurt your nipple if it is the wrong size or placed wrong.  Put a small amount of purified or modified lanolin on your nipple and areola if you are sore.  If you are using an electric pump, change the speed and suction power to be more comfortable.  You may need a different type of pump if pumping hurts or you do not get a lot of milk. Your doctor can help you pick what type of pump to use.  Keep a full water bottle near you always. Drinking lots of fluid helps you make more milk.  You can store your milk to use later. Pumped breast milk can be stored in a sealable, sterile container or plastic bag. Always put the date you pumped it on the container.  Milk can stay out at room temperature for up to 8 hours.  You can store your milk in the refrigerator for up to 8 days.  You can store your milk in the freezer for 3 months. Thaw frozen milk using warm water. Do not put it in the microwave.  Do not smoke.  Ask your doctor for help. WHEN SHOULD I CALL MY DOCTOR?  You have a hard time pumping.  You are worried you do not make enough milk.  You have nipple pain, soreness, or redness.  You want to take birth control pills.   This information is not intended to replace advice given to you by your health care provider. Make sure you discuss any questions you have with your health care provider.   Document Released: 03/10/2008 Document Revised: 09/27/2013 Document Reviewed: 07/15/2013 Elsevier Interactive Patient Education 2016 [employer]  Home Care Instructions for Mom After discharge, you may discover that you still have questions about body changes, activity, and care during the next few weeks. The following information should be helpful in answering many of your questions. ACTIVITY  Gradually resume your daily activities at home.  Allow time for rest periods  during the day and nap while your newborn sleeps.  Avoid heavy lifting (more than 10 lb [4.5 kg]) and strenuous work or sports. Slow to moderate walking is usually safe.  If you had a cesarean delivery, do not vacuum, climb stairs, or drive a car for 4 to 6 weeks.  If you had a cesarean delivery, make arrangements for help at home until you feel you are okay to do your usual activities yourself.  Ask your caregiver for safe exercises to do after delivery, especially if you had a cesarean delivery. VAGINAL FLOW AND RETURN OF YOUR MENSTRUAL PERIOD  Vaginal flow may continue for 4 to 6 weeks after delivery.  Usually the amount decreases and the color of blood gets lighter.  Bright red blood and an increased flow may reoccur if you have been too active.  Lie down, raise (elevate) your feet, place a cold compress on your lower abdomen, rest, and call your caregiver if you are soaking more than 1 pad an hour or passing large clots.  Menstrual periods will usually return 6 to 8 weeks after delivery.  If you are breastfeeding, your period will return anywhere from 8 weeks to the time you stop breastfeeding. PERINEAL CARE  Use the peri bottle and change pads each time you go to the bathroom.  Use towelettes in place of toilet paper until stitches are healed.  Take warm tub baths for 15 to 20 minutes.  Continue to use medicated pads or pain relieving spray.  Lidocaine cream for episiotomy pain can be used with your caregiver's approval.  Do not use tampons or douches until vaginal bleeding has stopped (about 4 weeks).  Sexual intercourse should be avoided for at least 3 to 4 weeks after delivery or until the brownish-red vaginal flow is completely gone.  Wipe from front to back. INCISION CARE (AFTER A CESAREAN DELIVERY)  Shower as desired. Try to keep your incision dry. BOWELS AND HEMORRHOIDS  Drink at least 6 to 8 glasses of non-caffeinated fluids per day.  Eat fiber-rich diet  with whole grains, raw fruits, and vegetables.  Take frequent warm tub baths if hemorrhoids are a problem.  Avoid straining when having a bowel movement.  Over-the-counter medicines and stool softeners can be used as directed by your caregiver. NUTRITION  Eat a well-balanced diet that includes the basic food groups.  Do not try to lose weight quickly by cutting back on calories.  If you are breastfeeding, drink at least 8 to 10 glasses of non-caffeinated fluid per day and increase your intake by 600 calories a day.  Take your prenatal vitamins until your postpartum checkup  or until your caregiver tells you to stop. BREASTFEEDING If you are not breastfeeding:  Wear a good tight-fitting bra.  Limit fluid intake after 1 or 2 days after delivery, or as directed, if your breasts are becoming engorged.  Avoid nipple stimulation and apply cool (not icy cold) compresses to the breasts for comfort as needed.  Avoid drinking alcohol and caffeinated drinks.  Mild over-the-counter pain medication recommended by your caregiver is helpful for breast discomfort.  Medications to dry up breast milk are not recommended. If you are breastfeeding:  Encourage your newborn to breastfeed if you think he or she is hungry.  Wash your hands before breastfeeding.  Clean your breasts with warm water before nursing.  Start to encourage feeding 8 to 12 times a day for 10 to 15 minutes on each breast in the beginning to help stimulate milk production and train your newborn.  Avoid water and formula supplements for your newborn unless otherwise directed.  Have your newborn seen by his or her caregiver 3 to 5 days after delivery and again at 2 to 3 weeks to evaluate his or her progress with breastfeeding.  Call your newborn's caregiver if you think he or she is not gaining weight or may be losing weight. POSTPARTUM BLUES Following delivery, your body goes through a drastic change in hormone levels. You  may find yourself crying for no apparent reason and unable to cope with all the changes a newborn brings. Get support from your partner and friends. Give yourself time to adjust. If these feelings persist after several weeks, contact your caregiver or other professionals that can help you. Call your local emergency department, go to the emergency room, or get help right away from a relative, friend, or neighbor if you feel you are about to harm yourself, your newborn, or anyone else. EXERCISES Start Kegel exercises right after delivery. You can do it while standing, sitting, or lying down. Tighten your stomach muscles and the muscles surrounding your birth canal. Hold for a few seconds and then relax. Repeat 5 times each time. Make Kegel exercises a part of your daily routine to maintain the muscle tone that supports your vagina, bladder, and bowels. SELF BREAST EXAMINATION  Do breast self-exams at the same time of the month, each month.  Any lump, bump, or discharge should be reported to your caregiver.  Check your breasts, if you are breastfeeding, just after a feeding when your breasts are less full. If your period has started and you are breastfeeding, check your breasts on the fifth to seventh day of your period.  Breasts are normally lumpy if you are breastfeeding due to the fullness of the milk cells. This is temporary and not a health risk. INTIMACY AND SEXUALITY New parents need time to adjust to each other intimately and sexually after giving birth. Try to spend time as a couple discussing ways to adjust to your infant, your new schedule, and how to meet both your desires and needs. Counseling can be helpful in deeply troubled cases. If you are breastfeeding or not yet having a menstrual period, you can get pregnant. Use some form of birth control to prevent getting pregnant. Talk to your caregiver about the birth control choices that are available to you for you situation. SEEK IMMEDIATE  MEDICAL CARE IF:   Drainage increases from the Cesarean incision, episiotomy or tear site, or the drainage starts to smell bad.  You soak pads with blood in 1 hour or less.  You have severe lower abdominal pain or cramping.  You have a bad smelling vaginal discharge.  You have increased rather than decreased pain around stitches or swelling, redness, or hardness in area.  You have an oral temperature above 102 F (38.9 C), not controlled by medicine.  You have pain or redness in the calf of the leg.  You feel sick to your stomach (nausea) and throw up (vomit) for 12 hours.  You have sudden, severe chest pain.  You have shortness of breath.  You have painful or bloody urination.  You have visual problems.  You develop a severe headache.  An area of your breast is red and sore, and you have a fever. You may feel like you have flu symptoms.   This information is not intended to replace advice given to you by your health care provider. Make sure you discuss any questions you have with your health care provider.   Document Released: 09/19/2000 Document Revised: 12/15/2011 Document Reviewed: 03/26/2015 Elsevier Interactive Patient Education 2016 [employer] Inc[employer]  Vaginal Delivery, Care After Refer to this sheet in the next few weeks. These discharge instructions provide you with information on caring for yourself after delivery. Your health care provider may also give you specific instructions. Your treatment has been planned according to the most current medical practices available, but problems sometimes occur. Call your health care provider if you have any problems or questions after you go home. HOME CARE INSTRUCTIONS  Take over-the-counter or prescription medicines only as directed by your health care provider or pharmacist.  Do not drink alcohol, especially if you are breastfeeding or taking medicine to relieve pain.  Do not chew or smoke tobacco.  Do not use illegal  drugs.  Continue to use good perineal care. Good perineal care includes:  Wiping your perineum from front to back.  Keeping your perineum clean.  Do not use tampons or douche until your health care provider says it is okay.  Shower, wash your hair, and take tub baths as directed by your health care provider.  Wear a well-fitting bra that provides breast support.  Eat healthy foods.  Drink enough fluids to keep your urine clear or pale yellow.  Eat high-fiber foods such as whole grain cereals and breads, brown rice, beans, and fresh fruits and vegetables every day. These foods may help prevent or relieve constipation.  Follow your health care provider's recommendations regarding resumption of activities such as climbing stairs, driving, lifting, exercising, or traveling.  Talk to your health care provider about resuming sexual activities. Resumption of sexual activities is dependent upon your risk of infection, your rate of healing, and your comfort and desire to resume sexual activity.  Try to have someone help you with your household activities and your newborn for at least a few days after you leave the hospital.  Rest as much as possible. Try to rest or take a nap when your newborn is sleeping.  Increase your activities gradually.  Keep all of your scheduled postpartum appointments. It is very important to keep your scheduled follow-up appointments. At these appointments, your health care provider will be checking to make sure that you are healing physically and emotionally. SEEK MEDICAL CARE IF:   You are passing large clots from your vagina. Save any clots to show your health care provider.  You have a foul smelling discharge from your vagina.  You have trouble urinating.  You are urinating frequently.  You have pain when you urinate.  You  have a change in your bowel movements.  You have increasing redness, pain, or swelling near your vaginal incision (episiotomy) or  vaginal tear.  You have pus draining from your episiotomy or vaginal tear.  Your episiotomy or vaginal tear is separating.  You have painful, hard, or reddened breasts.  You have a severe headache.  You have blurred vision or see spots.  You feel sad or depressed.  You have thoughts of hurting yourself or your newborn.  You have questions about your care, the care of your newborn, or medicines.  You are dizzy or light-headed.  You have a rash.  You have nausea or vomiting.  You were breastfeeding and have not had a menstrual period within 12 weeks after you stopped breastfeeding.  You are not breastfeeding and have not had a menstrual period by the 12th week after delivery.  You have a fever. SEEK IMMEDIATE MEDICAL CARE IF:   You have persistent pain.  You have chest pain.  You have shortness of breath.  You faint.  You have leg pain.  You have stomach pain.  Your vaginal bleeding saturates two or more sanitary pads in 1 hour.   This information is not intended to replace advice given to you by your health care provider. Make sure you discuss any questions you have with your health care provider.   Document Released: 09/19/2000 Document Revised: 06/13/2015 Document Reviewed: 05/19/2012 Elsevier Interactive Patient Education 2016 [employer]. Postpartum Depression and Baby Blues The postpartum period begins right after the birth of a baby. During this time, there is often a great amount of joy and excitement. It is also a time of many changes in the life of the parents. Regardless of how many times a mother gives birth, each child brings new challenges and dynamics to the family. It is not unusual to have feelings of excitement along with confusing shifts in moods, emotions, and thoughts. All mothers are at risk of developing postpartum depression or the "baby blues." These mood changes can occur right after giving birth, or they may occur many months after giving  birth. The baby blues or postpartum depression can be mild or severe. Additionally, postpartum depression can go away rather quickly, or it can be a long-term condition.  CAUSES Raised hormone levels and the rapid drop in those levels are thought to be a main cause of postpartum depression and the baby blues. A number of hormones change during and after pregnancy. Estrogen and progesterone usually decrease right after the delivery of your baby. The levels of thyroid hormone and various cortisol steroids also rapidly drop. Other factors that play a role in these mood changes include major life events and genetics.  RISK FACTORS If you have any of the following risks for the baby blues or postpartum depression, know what symptoms to watch out for during the postpartum period. Risk factors that may increase the likelihood of getting the baby blues or postpartum depression include:  Having a personal or family history of depression.   Having depression while being pregnant.   Having premenstrual mood issues or mood issues related to oral contraceptives.  Having a lot of life stress.   Having marital conflict.   Lacking a social support network.   Having a baby with special needs.   Having health problems, such as diabetes.  SIGNS AND SYMPTOMS Symptoms of baby blues include:  Brief changes in mood, such as going from extreme happiness to sadness.  Decreased concentration.   Difficulty  sleeping.   Crying spells, tearfulness.   Irritability.   Anxiety.  Symptoms of postpartum depression typically begin within the first month after giving birth. These symptoms include:  Difficulty sleeping or excessive sleepiness.   Marked weight loss.   Agitation.   Feelings of worthlessness.   Lack of interest in activity or food.  Postpartum psychosis is a very serious condition and can be dangerous. Fortunately, it is rare. Displaying any of the following symptoms is cause  for immediate medical attention. Symptoms of postpartum psychosis include:   Hallucinations and delusions.   Bizarre or disorganized behavior.   Confusion or disorientation.  DIAGNOSIS  A diagnosis is made by an evaluation of your symptoms. There are no medical or lab tests that lead to a diagnosis, but there are various questionnaires that a health care provider may use to identify those with the baby blues, postpartum depression, or psychosis. Often, a screening tool called the [place] Postnatal Depression Scale is used to diagnose depression in the postpartum period.  TREATMENT The baby blues usually goes away on its own in 1-2 weeks. Social support is often all that is needed. You will be encouraged to get adequate sleep and rest. Occasionally, you may be given medicines to help you sleep.  Postpartum depression requires treatment because it can last several months or longer if it is not treated. Treatment may include individual or group therapy, medicine, or both to address any social, physiological, and psychological factors that may play a role in the depression. Regular exercise, a healthy diet, rest, and social support may also be strongly recommended.  Postpartum psychosis is more serious and needs treatment right away. Hospitalization is often needed. HOME CARE INSTRUCTIONS  Get as much rest as you can. Nap when the baby sleeps.   Exercise regularly. Some women find yoga and walking to be beneficial.   Eat a balanced and nourishing diet.   Do little things that you enjoy. Have a cup of tea, take a bubble bath, read your favorite magazine, or listen to your favorite music.  Avoid alcohol.   Ask for help with household chores, cooking, grocery shopping, or running errands as needed. Do not try to do everything.   Talk to people close to you about how you are feeling. Get support from your partner, family members, friends, or other new moms.  Try to stay positive in how  you think. Think about the things you are grateful for.   Do not spend a lot of time alone.   Only take over-the-counter or prescription medicine as directed by your health care provider.  Keep all your postpartum appointments.   Let your health care provider know if you have any concerns.  SEEK MEDICAL CARE IF: You are having a reaction to or problems with your medicine. SEEK IMMEDIATE MEDICAL CARE IF:  You have suicidal feelings.   You think you may harm the baby or someone else. MAKE SURE YOU:  Understand these instructions.  Will watch your condition.  Will get help right away if you are not doing well or get worse.   This information is not intended to replace advice given to you by your health care provider. Make sure you discuss any questions you have with your health care provider.   Document Released: 06/26/2004 Document Revised: 09/27/2013 Document Reviewed: 07/04/2013 Elsevier Interactive Patient Education 2016 Elsevier Inc. Breastfeeding and Mastitis Mastitis is inflammation of the breast tissue. It can occur in women who are breastfeeding. This can make  breastfeeding painful. Mastitis will sometimes go away on its own. Your health care provider will help determine if treatment is needed. CAUSES Mastitis is often associated with a blocked milk (lactiferous) duct. This can happen when too much milk builds up in the breast. Causes of excess milk in the breast can include:  Poor latch-on. If your baby is not latched onto the breast properly, she or he may not empty your breast completely while breastfeeding.  Allowing too much time to pass between feedings.  Wearing a bra or other clothing that is too tight. This puts extra pressure on the lactiferous ducts so milk does not flow through them as it should. Mastitis can also be caused by a bacterial infection. Bacteria may enter the breast tissue through cuts or openings in the skin. In women who are breastfeeding,  this may occur because of cracked or irritated skin. Cracks in the skin are often caused when your baby does not latch on properly to the breast. SIGNS AND SYMPTOMS  Swelling, redness, tenderness, and pain in an area of the breast.  Swelling of the glands under the arm on the same side.  Fever may or may not accompany mastitis. If an infection is allowed to progress, a collection of pus (abscess) may develop. DIAGNOSIS  Your health care provider can usually diagnose mastitis based on your symptoms and a physical exam. Tests may be done to help confirm the diagnosis. These may include:  Removal of pus from the breast by applying pressure to the area. This pus can be examined in the lab to determine which bacteria are present. If an abscess has developed, the fluid in the abscess can be removed with a needle. This can also be used to confirm the diagnosis and determine the bacteria present. In most cases, pus will not be present.  Blood tests to determine if your body is fighting a bacterial infection.  Mammogram or ultrasound tests to rule out other problems or diseases. TREATMENT  Mastitis that occurs with breastfeeding will sometimes go away on its own. Your health care provider may choose to wait 24 hours after first seeing you to decide whether a prescription medicine is needed. If your symptoms are worse after 24 hours, your health care provider will likely prescribe an antibiotic medicine to treat the mastitis. He or she will determine which bacteria are most likely causing the infection and will then select an appropriate antibiotic medicine. This is sometimes changed based on the results of tests performed to identify the bacteria, or if there is no response to the antibiotic medicine selected. Antibiotic medicines are usually given by mouth. You may also be given medicine for pain. HOME CARE INSTRUCTIONS  Only take over-the-counter or prescription medicines for pain, fever, or discomfort  as directed by your health care provider.  If your health care provider prescribed an antibiotic medicine, take the medicine as directed. Make sure you finish it even if you start to feel better.  Do not wear a tight or underwire bra. Wear a soft, supportive bra.  Increase your fluid intake, especially if you have a fever.  Continue to empty the breast. Your health care provider can tell you whether this milk is safe for your infant or needs to be thrown out. You may be told to stop nursing until your health care provider thinks it is safe for your baby. Use a breast pump if you are advised to stop nursing.  Keep your nipples clean and dry.  Empty  the first breast completely before going to the other breast. If your baby is not emptying your breasts completely for some reason, use a breast pump to empty your breasts.  If you go back to work, pump your breasts while at work to stay in time with your nursing schedule.  Avoid allowing your breasts to become overly filled with milk (engorged). SEEK MEDICAL CARE IF:  You have pus-like discharge from the breast.  Your symptoms do not improve with the treatment prescribed by your health care provider within 2 days. SEEK IMMEDIATE MEDICAL CARE IF:  Your pain and swelling are getting worse.  You have pain that is not controlled with medicine.  You have a red line extending from the breast toward your armpit.  You have a fever or persistent symptoms for more than 2-3 days.  You have a fever and your symptoms suddenly get worse. MAKE SURE YOU:   Understand these instructions.  Will watch your condition.  Will get help right away if you are not doing well or get worse.   This information is not intended to replace advice given to you by your health care provider. Make sure you discuss any questions you have with your health care provider.   Document Released: 01/17/2005 Document Revised: 09/27/2013 Document Reviewed:  04/28/2013 Elsevier Interactive Patient Education [employer]

## 2015-10-31 ENCOUNTER — Inpatient Hospital Stay (HOSPITAL_COMMUNITY): Admission: AD | Admit: 2015-10-31 | Payer: Self-pay | Source: Ambulatory Visit | Admitting: Obstetrics and Gynecology

## 2015-11-06 ENCOUNTER — Ambulatory Visit (HOSPITAL_COMMUNITY)
Admission: RE | Admit: 2015-11-06 | Discharge: 2015-11-06 | Disposition: A | Discharge status: Home / Self Care | Payer: BLUE CROSS/BLUE SHIELD | Source: Ambulatory Visit | Attending: Certified Nurse Midwife | Admitting: Certified Nurse Midwife

## 2015-11-06 NOTE — Lactation Note (Addendum)
Lactation Consult  Mother's reason for visit:  Infant chokes at the breast Visit Type:  OP Appointment Notes:  [name] is gaining well but is here today because she is having difficulty managing the flow of mom's milk.  She gasps, chokes comes off the breast and then has to catch her breath before resuming BF.  At one point she came off holding her breath and was dusky for a few moments but quickly recovered and continued eating.  A #24 NS was introduced to help manage the flow. Mom reported increased comfort and [name] was less overwhelmed with the milk.  She transferred 34 ml but came off twice related to choking.  She was positioned on the right breast and transferred an additional 22 ml for a total of 56.  She continued rooting and was returned to the left breast.  She suckled a few more moments but did not transfer additional milk. He oral assessment reveals that her mouth closes when she elevates her tongue. She is able to extend it but when her mouth is open there is snapback on a gloved finger.  A thick frenum is noted sublingually. SHe does not flange her upper lip well.  Her jaw was also quivering during the feeding.  Coughing , choking and and jaw quivering are suggestive of oral restriction.  She seems to have difficulty with the suck, swallow, breath pattern .Plan is to BF with the nipple shield after pre-pumping or hand expression to handle the initial MER and follow-up with lactation Feb 8.  Pediatrician appointment later this week.   Consult:  Initial Lactation Consultant:  Soyla Dryer  ________________________________________________________________________   Dana Anderson Name: Dana Anderson Date of Birth: 10/26/2015 Pediatrician: Ramonita Lab Gender: female Gestational Age: [redacted]w[redacted]d (At Birth) Birth Weight: 7 lb 3.7 oz (3280 g) Weight at Discharge: Weight: 6 lb 15.3 oz (3155 g)Date of Discharge: 10/27/2015 [facility] Weights   10/26/15 0003 10/27/15 0010   Weight: 7 lb 3.7 oz (3280 g) 6 lb 15.3 oz (3155 g)   Last weight taken from location outside of [facility]: 6+15 oz Location:Pediatrician's office Weight today: 7 # 9oz     ________________________________________________________________________  Mother's Name: Dana Anderson  Maternal Medical Conditions:  NA Maternal Medications:  Folic acid and prenatal  ________________________________________________________________________  Infant Intake and Output Assessment  Voids:  6 in 24 hrs.  Color:  Clear yellow Stools:  3+ in 24 hrs.  Color:  Yellow  ________________________________________________________________________

## 2015-11-14 ENCOUNTER — Ambulatory Visit (HOSPITAL_COMMUNITY)
Admission: RE | Admit: 2015-11-14 | Discharge: 2015-11-14 | Disposition: A | Discharge status: Home / Self Care | Payer: BLUE CROSS/BLUE SHIELD | Source: Ambulatory Visit | Attending: Certified Nurse Midwife | Admitting: Certified Nurse Midwife

## 2015-11-14 NOTE — Lactation Note (Signed)
Lactation Consult  Mother's reason for visit: feeding assessment Visit Type:  Outpatient Consult:  Follow-Up Lactation Consultant:  Hardie Pulley  ________________________________________________________________________  Dana Anderson Name: Dana Anderson Date of Birth: 10/26/2015 Pediatrician: Victorino Dike Summer  Gender: female Gestational Age: [redacted]w[redacted]d (At Birth) Birth Weight: 7 lb 3.7 oz (3280 g) Weight at Discharge: Weight: 6 lb 15.3 oz (3155 g)Date of Discharge: 10/27/2015 [facility] Weights   10/26/15 0003 10/27/15 0010  Weight: 7 lb 3.7 oz (3280 g) 6 lb 15.3 oz (3155 g)   Last weight taken from location outside of [facility]:7 lb 9 oz. 2/3 Location:Pediatrician's office Weight today: 8 lb 1.8 oz  Mother has had a strong let down reflex when breastfeeding where baby gags and pulls off breast frequently.  A #24 nipple shield was introduced at the last OP appointment to slow flow.  Mother states latching has improved with the exception of night time feedings.  She still seems to pull off and on breast to control the strong let down reflex.  Mother breastfed baby on both breasts during consult but has only been breastfeeding on one breast prior to visit.  No gagging, choking or pulling off observed.  Sucks and swallows observed.  Mother has been pumping 6 x a day.  Suggest reducing pumping to 3-4x a day for 10-15 min.  Breastfeed on both breasts per feeding if baby's desires. Consider pumping as an alternative to breastfeeding during the night until less fussiness at breast is observed and to reduce feeding stress during the night.  Refer to weight gain chart so mother will feel more confident about baby's weight gain.  Suggest she attend support group for weight check and answer further questions.  Once baby adjust to flow and volume mother can start to breastfeed without nipple shield and eliminate pumping as long as weight is in acceptable range.   _______________________________________________________________________  Mother's Name: Dana Anderson Type of delivery:   Breastfeeding Experience:  Multitip ________________________________________________________________________  Breastfeeding History (Post Discharge)  Frequency of breastfeeding:  Every 2-3 hours Duration of feeding:  30-45 min  Infant Intake and Output Assessment  Voids:  7 in 24 hrs.  Color:  Clear yellow Stools:  3  in 24 hrs.  Color:  Yellow  ________________________________________________________________________  Maternal Breast Assessment  Breast:  Full Nipple:  Erect Pain level:  0 _______________________________________________________________________ Feeding Assessment/Evaluation  Initial feeding assessment:  Infant's oral assessment:  WNL  Positioning:  Football Left breast  LATCH documentation:  Latch:  2 = Grasps breast easily, tongue down, lips flanged, rhythmical sucking.  Audible swallowing:  2 = Spontaneous and intermittent  Type of nipple:  2 = Everted at rest and after stimulation  Comfort (Breast/Nipple):  2 = Soft / non-tender  Hold (Positioning):  2 = No assistance needed to correctly position infant at breast  LATCH score:  10  Attached assessment:  Deep  Lips flanged:  Yes.    Lips untucked:  Yes.    Suck assessment:  Displays both  Tools:  Nipple shield 24 mm Instructed on use and cleaning of tool:  Yes.    Pre-feed weight:  3624 g  Post-feed weight:  3670 g  Amount transferred:  46 ml Additional Feeding Assessment -   Infant's oral assessment:  WNL  Positioning:  Football Right breast  LATCH documentation:  Latch:  2 = Grasps breast easily, tongue down, lips flanged, rhythmical sucking.  Audible swallowing:  2 = Spontaneous and intermittent  Type of nipple:  2 =  Everted at rest and after stimulation  Comfort (Breast/Nipple):  2 = Soft / non-tender  Hold (Positioning):  2 = No assistance needed to  correctly position infant at breast  LATCH score:  10  Attached assessment:  Deep  Lips flanged:  Yes.    Lips untucked:  Yes.    Suck assessment:  Nonnutritive and Displays both  Tools:  Nipple shield 24 mm Instructed on use and cleaning of tool:  Yes.    Pre-feed weight:  3670 g  Post-feed weight:  3678 g  Amount transferred:  8 ml Total amount pumped post feed:  R  8 ml    L 46 ml  Total amount transferred:  54 ml

## 2015-11-23 ENCOUNTER — Encounter (HOSPITAL_COMMUNITY): Payer: Self-pay | Admitting: *Deleted

## 2015-11-23 ENCOUNTER — Inpatient Hospital Stay (HOSPITAL_COMMUNITY)
Admission: AD | Admit: 2015-11-23 | Discharge: 2015-11-23 | Disposition: A | Discharge status: Home / Self Care | Payer: BLUE CROSS/BLUE SHIELD | Source: Ambulatory Visit | Attending: Obstetrics and Gynecology | Admitting: Obstetrics and Gynecology

## 2015-11-23 ENCOUNTER — Inpatient Hospital Stay (HOSPITAL_COMMUNITY): Payer: BLUE CROSS/BLUE SHIELD

## 2015-11-23 DIAGNOSIS — O9963 Diseases of the digestive system complicating the puerperium: Secondary | ICD-10-CM | POA: Insufficient documentation

## 2015-11-23 DIAGNOSIS — R1013 Epigastric pain: Secondary | ICD-10-CM | POA: Diagnosis present

## 2015-11-23 DIAGNOSIS — R079 Chest pain, unspecified: Secondary | ICD-10-CM

## 2015-11-23 DIAGNOSIS — K5900 Constipation, unspecified: Secondary | ICD-10-CM | POA: Insufficient documentation

## 2015-11-23 DIAGNOSIS — R109 Unspecified abdominal pain: Secondary | ICD-10-CM | POA: Diagnosis present

## 2015-11-23 DIAGNOSIS — R1010 Upper abdominal pain, unspecified: Secondary | ICD-10-CM | POA: Diagnosis not present

## 2015-11-23 DIAGNOSIS — K8020 Calculus of gallbladder without cholecystitis without obstruction: Secondary | ICD-10-CM | POA: Diagnosis not present

## 2015-11-23 DIAGNOSIS — M549 Dorsalgia, unspecified: Secondary | ICD-10-CM | POA: Diagnosis present

## 2015-11-23 HISTORY — DX: Upper abdominal pain, unspecified: R10.10

## 2015-11-23 HISTORY — DX: Epigastric pain: R10.13

## 2015-11-23 HISTORY — DX: Calculus of gallbladder without cholecystitis without obstruction: K80.20

## 2015-11-23 HISTORY — DX: Dorsalgia, unspecified: M54.9

## 2015-11-23 LAB — CBC
HEMATOCRIT: 39 % (ref 36.0–46.0)
Hemoglobin: 13.3 g/dL (ref 12.0–15.0)
MCH: 29.5 pg (ref 26.0–34.0)
MCHC: 34.1 g/dL (ref 30.0–36.0)
MCV: 86.5 fL (ref 78.0–100.0)
Platelets: 322 10*3/uL (ref 150–400)
RBC: 4.51 MIL/uL (ref 3.87–5.11)
RDW: 13.8 % (ref 11.5–15.5)
WBC: 9.8 10*3/uL (ref 4.0–10.5)

## 2015-11-23 LAB — BASIC METABOLIC PANEL
ANION GAP: 10 (ref 5–15)
BUN: 19 mg/dL (ref 6–20)
CALCIUM: 9.1 mg/dL (ref 8.9–10.3)
CO2: 28 mmol/L (ref 22–32)
Chloride: 105 mmol/L (ref 101–111)
Creatinine, Ser: 0.76 mg/dL (ref 0.44–1.00)
Glucose, Bld: 117 mg/dL — ABNORMAL HIGH (ref 65–99)
Potassium: 3.9 mmol/L (ref 3.5–5.1)
SODIUM: 143 mmol/L (ref 135–145)

## 2015-11-23 LAB — COMPREHENSIVE METABOLIC PANEL
ALBUMIN: 4.3 g/dL (ref 3.5–5.0)
ALT: 171 U/L — ABNORMAL HIGH (ref 14–54)
AST: 264 U/L — AB (ref 15–41)
Alkaline Phosphatase: 132 U/L — ABNORMAL HIGH (ref 38–126)
Anion gap: 8 (ref 5–15)
BUN: 19 mg/dL (ref 6–20)
CHLORIDE: 105 mmol/L (ref 101–111)
CO2: 29 mmol/L (ref 22–32)
Calcium: 9.4 mg/dL (ref 8.9–10.3)
Creatinine, Ser: 0.77 mg/dL (ref 0.44–1.00)
GFR calc Af Amer: >60 mL/min (ref >60)
GFR calc non Af Amer: >60 mL/min (ref >60)
GLUCOSE: 116 mg/dL — AB (ref 65–99)
POTASSIUM: 3.9 mmol/L (ref 3.5–5.1)
SODIUM: 142 mmol/L (ref 135–145)
Total Bilirubin: 0.9 mg/dL (ref 0.3–1.2)
Total Protein: 6.8 g/dL (ref 6.5–8.1)

## 2015-11-23 LAB — AMYLASE: Amylase: 51 U/L (ref 28–100)

## 2015-11-23 LAB — LIPASE, BLOOD: Lipase: 34 U/L (ref 11–51)

## 2015-11-23 MED ORDER — IBUPROFEN 800 MG PO TABS
800.0000 mg | ORAL_TABLET | Freq: Once | ORAL | Status: AC
  Administered 2015-11-23: 800 mg via ORAL
  Filled 2015-11-23: qty 1

## 2015-11-23 MED ORDER — GI COCKTAIL ~~LOC~~
30.0000 mL | Freq: Once | ORAL | Status: AC
  Administered 2015-11-23: 30 mL via ORAL
  Filled 2015-11-23: qty 30

## 2015-11-23 MED ORDER — IBUPROFEN 800 MG PO TABS: 400.0000 mg | ORAL_TABLET | Freq: Once | ORAL | Status: DC

## 2015-11-23 NOTE — Discharge Instructions (Signed)
Abdominal Pain, Adult Many things can cause abdominal pain. Usually, abdominal pain is not caused by a disease and will improve without treatment. It can often be observed and treated at home. Your health care provider will do a physical exam and possibly order blood tests and X-rays to help determine the seriousness of your pain. However, in many cases, more time must pass before a clear cause of the pain can be found. Before that point, your health care provider may not know if you need more testing or further treatment. HOME CARE INSTRUCTIONS Monitor your abdominal pain for any changes. The following actions may help to alleviate any discomfort you are experiencing:  Only take over-the-counter or prescription medicines as directed by your health care provider.  Do not take laxatives unless directed to do so by your health care provider.  Try a clear liquid diet (broth, tea, or water) as directed by your health care provider. Slowly move to a bland diet as tolerated. SEEK MEDICAL CARE IF:  You have unexplained abdominal pain.  You have abdominal pain associated with nausea or diarrhea.  You have pain when you urinate or have a bowel movement.  You experience abdominal pain that wakes you in the night.  You have abdominal pain that is worsened or improved by eating food.  You have abdominal pain that is worsened with eating fatty foods.  You have a fever. SEEK IMMEDIATE MEDICAL CARE IF:  Your pain does not go away within 2 hours.  You keep throwing up (vomiting).  Your pain is felt only in portions of the abdomen, such as the right side or the left lower portion of the abdomen.  You pass bloody or black tarry stools. MAKE SURE YOU:  Understand these instructions.  Will watch your condition.  Will get help right away if you are not doing well or get worse.   This information is not intended to replace advice given to you by your health care provider. Make sure you discuss  any questions you have with your health care provider.   Document Released: 07/02/2005 Document Revised: 06/13/2015 Document Reviewed: 06/01/2013 Elsevier Interactive Patient Education 2016 [employer]. Cholelithiasis Cholelithiasis (also called gallstones) is a form of gallbladder disease in which gallstones form in your gallbladder. The gallbladder is an organ that stores bile made in the liver, which helps digest fats. Gallstones begin as small crystals and slowly grow into stones. Gallstone pain occurs when the gallbladder spasms and a gallstone is blocking the duct. Pain can also occur when a stone passes out of the duct.  RISK FACTORS  Being female.   Having multiple pregnancies. Health care providers sometimes advise removing diseased gallbladders before future pregnancies.   Being obese.  Eating a diet heavy in fried foods and fat.   Being older than 60 years and increasing age.   Prolonged use of medicines containing female hormones.   Having diabetes mellitus.   Rapidly losing weight.   Having a family history of gallstones (heredity).  SYMPTOMS  Nausea.   Vomiting.  Abdominal pain.   Yellowing of the skin (jaundice).   Sudden pain. It may persist from several minutes to several hours.  Fever.   Tenderness to the touch. In some cases, when gallstones do not move into the bile duct, people have no pain or symptoms. These are called "silent" gallstones.  TREATMENT Silent gallstones do not need treatment. In severe cases, emergency surgery may be required. Options for treatment include:  Surgery to remove  the gallbladder. This is the most common treatment.  Medicines. These do not always work and may take 6-12 months or more to work.  Shock wave treatment (extracorporeal biliary lithotripsy). In this treatment an ultrasound machine sends shock waves to the gallbladder to break gallstones into smaller pieces that can pass into the intestines or be  dissolved by medicine. HOME CARE INSTRUCTIONS   Only take over-the-counter or prescription medicines for pain, discomfort, or fever as directed by your health care provider.   Follow a low-fat diet until seen again by your health care provider. Fat causes the gallbladder to contract, which can result in pain.   Follow up with your health care provider as directed. Attacks are almost always recurrent and surgery is usually required for permanent treatment.  SEEK IMMEDIATE MEDICAL CARE IF:   Your pain increases and is not controlled by medicines.   You have a fever or persistent symptoms for more than 2-3 days.   You have a fever and your symptoms suddenly get worse.   You have persistent nausea and vomiting.  MAKE SURE YOU:   Understand these instructions.  Will watch your condition.  Will get help right away if you are not doing well or get worse.   This information is not intended to replace advice given to you by your health care provider. Make sure you discuss any questions you have with your health care provider.   Document Released: 09/18/2005 Document Revised: 05/25/2013 Document Reviewed: 03/16/2013 Elsevier Interactive Patient Education 2016 [employer] Inc[employer] Fat and Cholesterol Restricted Diet High levels of fat and cholesterol in your blood may lead to various health problems, such as diseases of the heart, blood vessels, gallbladder, liver, and pancreas. Fats are concentrated sources of energy that come in various forms. Certain types of fat, including saturated fat, may be harmful in excess. Cholesterol is a substance needed by your body in small amounts. Your body makes all the cholesterol it needs. Excess cholesterol comes from the food you eat. When you have high levels of cholesterol and saturated fat in your blood, health problems can develop because the excess fat and cholesterol will gather along the walls of your blood vessels, causing them to narrow. Choosing  the right foods will help you control your intake of fat and cholesterol. This will help keep the levels of these substances in your blood within normal limits and reduce your risk of disease. WHAT IS MY PLAN? Your health care provider recommends that you:  Get no more than __________ % of the total calories in your daily diet from fat.  Limit your intake of saturated fat to less than ______% of your total calories each day.  Limit the amount of cholesterol in your diet to less than _________mg per day. WHAT TYPES OF FAT SHOULD I CHOOSE?  Choose healthy fats more often. Choose monounsaturated and polyunsaturated fats, such as olive and canola oil, flaxseeds, walnuts, almonds, and seeds.  Eat more omega-3 fats. Good choices include salmon, mackerel, sardines, tuna, flaxseed oil, and ground flaxseeds. Aim to eat fish at least two times a week.  Limit saturated fats. Saturated fats are primarily found in animal products, such as meats, butter, and cream. Plant sources of saturated fats include palm oil, palm kernel oil, and coconut oil.  Avoid foods with partially hydrogenated oils in them. These contain trans fats. Examples of foods that contain trans fats are stick margarine, some tub margarines, cookies, crackers, and other baked goods. WHAT GENERAL GUIDELINES DO  I NEED TO FOLLOW? These guidelines for healthy eating will help you control your intake of fat and cholesterol:  Check food labels carefully to identify foods with trans fats or high amounts of saturated fat.  Fill one half of your plate with vegetables and green salads.  Fill one fourth of your plate with whole grains. Look for the word "whole" as the first word in the ingredient list.  Fill one fourth of your plate with lean protein foods.  Limit fruit to two servings a day. Choose fruit instead of juice.  Eat more foods that contain soluble fiber. Examples of foods that contain this type of fiber are apples, broccoli,  carrots, beans, peas, and barley. Aim to get 20-30 g of fiber per day.  Eat more home-cooked food and less restaurant, buffet, and fast food.  Limit or avoid alcohol.  Limit foods high in starch and sugar.  Limit fried foods.  Cook foods using methods other than frying. Baking, boiling, grilling, and broiling are all great options.  Lose weight if you are overweight. Losing just 5-10% of your initial body weight can help your overall health and prevent diseases such as diabetes and heart disease. WHAT FOODS CAN I EAT? Grains Whole grains, such as whole wheat or whole grain breads, crackers, cereals, and pasta. Unsweetened oatmeal, bulgur, barley, quinoa, or brown rice. Corn or whole wheat flour tortillas. Vegetables Fresh or frozen vegetables (raw, steamed, roasted, or grilled). Green salads. Fruits All fresh, canned (in natural juice), or frozen fruits. Meat and Other Protein Products Ground beef (85% or leaner), grass-fed beef, or beef trimmed of fat. Skinless chicken or [place]. Ground chicken or [place]. Pork trimmed of fat. All fish and seafood. Eggs. Dried beans, peas, or lentils. Unsalted nuts or seeds. Unsalted canned or dry beans. Dairy Low-fat dairy products, such as skim or 1% milk, 2% or reduced-fat cheeses, low-fat ricotta or cottage cheese, or plain low-fat yogurt. Fats and Oils Tub margarines without trans fats. Light or reduced-fat mayonnaise and salad dressings. Avocado. Olive, canola, sesame, or safflower oils. Natural peanut or almond butter (choose ones without added sugar and oil). The items listed above may not be a complete list of recommended foods or beverages. Contact your dietitian for more options. WHAT FOODS ARE NOT RECOMMENDED? Grains White bread. White pasta. White rice. Cornbread. Bagels, pastries, and croissants. Crackers that contain trans fat. Vegetables White potatoes. Corn. Creamed or fried vegetables. Vegetables in a cheese sauce. Fruits Dried  fruits. Canned fruit in light or heavy syrup. Fruit juice. Meat and Other Protein Products Fatty cuts of meat. Ribs, chicken wings, bacon, sausage, bologna, salami, chitterlings, fatback, hot dogs, bratwurst, and packaged luncheon meats. Liver and organ meats. Dairy Whole or 2% milk, cream, half-and-half, and cream cheese. Whole milk cheeses. Whole-fat or sweetened yogurt. Full-fat cheeses. Nondairy creamers and whipped toppings. Processed cheese, cheese spreads, or cheese curds. Sweets and Desserts Corn syrup, sugars, honey, and molasses. Candy. Jam and jelly. Syrup. Sweetened cereals. Cookies, pies, cakes, donuts, muffins, and ice cream. Fats and Oils Butter, stick margarine, lard, shortening, ghee, or bacon fat. Coconut, palm kernel, or palm oils. Beverages Alcohol. Sweetened drinks (such as sodas, lemonade, and fruit drinks or punches). The items listed above may not be a complete list of foods and beverages to avoid. Contact your dietitian for more information.   This information is not intended to replace advice given to you by your health care provider. Make sure you discuss any questions you have with your  health care provider.   Document Released: 09/22/2005 Document Revised: 10/13/2014 Document Reviewed: 12/21/2013 Elsevier Interactive Patient Education [employer]

## 2015-11-23 NOTE — MAU Provider Note (Signed)
History     CSN: [id number]  Arrival date and time: 11/23/15 0017  Provider notified: 0034 & 0229 Orders placed in EPIC: 0102 Provider at bedside: 0310    Chief Complaint  Patient presents with  . Abdominal Pain   HPI  Ms. Dana Anderson is 30 yo G2P2002 female 4 wks postpartum presenting with complaints of upper abdominal pain that radiates to her chest and upper back.  Pain is rated 5/10 consistently until receiving a GI cocktail, but now the pain is coming back. She had these same complaints last week, but the sx's resolved with Gas-X.  She reports that the pain is usually episodic lasting 30-45 mins, but this time it has lasted a long time. Gas-X and Tums has not relieved the pain tonight.  She reports that the pain is so intense that it brings tears to her eyes and sometimes makes her double over in pain. She denies N/V/D. She reports having some constipation even with taking Miralax daily. She did have a "good BM" on 2/16. She last ate at 1900 on 2/16. She had no significant prenatal or immediate postpartum complications. Her primary OB/GYN provider at [facility] is Marlinda Mike, CNM.  No past medical history on file.  Past Surgical History  Procedure Laterality Date  . Tonsillectomy      Family History  Problem Relation Age of Onset  . Diabetes Mother   . Diabetes Maternal Grandmother   . Diabetes Maternal Grandfather     Social History  Substance Use Topics  . Smoking status: Never Smoker   . Smokeless tobacco: Never Used  . Alcohol Use: Yes     Comment: socially only when not pregnant     Allergies: No Known Allergies  Prescriptions prior to admission  Medication Sig Dispense Refill Last Dose  . docusate sodium (COLACE) 100 MG capsule Take 100 mg by mouth daily as needed for mild constipation.   Past Week at Unknown time  . hydrocortisone cream 1 % Apply 1 application topically daily as needed for itching.   11/22/2015 at Unknown time  . Prenatal Vit-Fe Fumarate-FA  (PRENATAL MULTIVITAMIN) TABS tablet Take 1 tablet by mouth daily at 12 noon.   11/22/2015 at Unknown time  . acetaminophen (TYLENOL) 325 MG tablet Take 650 mg by mouth every 6 (six) hours as needed. Reported on 10/25/2015   More than a month at Unknown time  . ibuprofen (ADVIL,MOTRIN) 600 MG tablet Take 1 tablet (600 mg total) by mouth every 6 (six) hours. 30 tablet 0 More than a month at Unknown time    Review of Systems  Constitutional: Negative.   HENT: Negative.   Eyes: Negative.   Respiratory: Negative.   Cardiovascular: Positive for chest pain.  Gastrointestinal: Positive for abdominal pain.  Genitourinary: Negative.   Musculoskeletal: Positive for back pain.       Upper   Skin: Negative.   Neurological: Negative.   Endo/Heme/Allergies: Negative.   Psychiatric/Behavioral: Negative.    Results for orders placed or performed during the hospital encounter of 11/23/15 (from the past 24 hour(s))  Basic metabolic panel     Status: Abnormal   Collection Time: 11/23/15 12:50 AM  Result Value Ref Range   Sodium 143 135 - 145 mmol/L   Potassium 3.9 3.5 - 5.1 mmol/L   Chloride 105 101 - 111 mmol/L   CO2 28 22 - 32 mmol/L   Glucose, Bld 117 (H) 65 - 99 mg/dL   BUN 19 6 - 20 mg/dL  Creatinine, Ser 0.76 0.44 - 1.00 mg/dL   Calcium 9.1 8.9 - 16.1 mg/dL   GFR calc non Af Amer >60 >60 mL/min   GFR calc Af Amer >60 >60 mL/min   Anion gap 10 5 - 15  Comprehensive metabolic panel     Status: Abnormal   Collection Time: 11/23/15 12:50 AM  Result Value Ref Range   Sodium 142 135 - 145 mmol/L   Potassium 3.9 3.5 - 5.1 mmol/L   Chloride 105 101 - 111 mmol/L   CO2 29 22 - 32 mmol/L   Glucose, Bld 116 (H) 65 - 99 mg/dL   BUN 19 6 - 20 mg/dL   Creatinine, Ser 0.96 0.44 - 1.00 mg/dL   Calcium 9.4 8.9 - 04.5 mg/dL   Total Protein 6.8 6.5 - 8.1 g/dL   Albumin 4.3 3.5 - 5.0 g/dL   AST 409 (H) 15 - 41 U/L   ALT 171 (H) 14 - 54 U/L   Alkaline Phosphatase 132 (H) 38 - 126 U/L   Total Bilirubin  0.9 0.3 - 1.2 mg/dL   GFR calc non Af Amer >60 >60 mL/min   GFR calc Af Amer >60 >60 mL/min   Anion gap 8 5 - 15  Lipase, blood     Status: None   Collection Time: 11/23/15 12:50 AM  Result Value Ref Range   Lipase 34 11 - 51 U/L  Amylase     Status: None   Collection Time: 11/23/15 12:50 AM  Result Value Ref Range   Amylase 51 28 - 100 U/L  CBC     Status: None   Collection Time: 11/23/15  4:19 AM  Result Value Ref Range   WBC 9.8 4.0 - 10.5 K/uL   RBC 4.51 3.87 - 5.11 MIL/uL   Hemoglobin 13.3 12.0 - 15.0 g/dL   HCT 81.1 91.4 - 78.2 %   MCV 86.5 78.0 - 100.0 fL   MCH 29.5 26.0 - 34.0 pg   MCHC 34.1 30.0 - 36.0 g/dL   RDW 95.6 21.3 - 08.6 %   Platelets 322 150 - 400 K/uL   Dg Chest 2 View  11/23/2015  CLINICAL DATA:  Recently pregnant patient delivered vaginally on 10/26/2015. Right-sided back pain moving to the stomach and right chest beginning tonight at 9 p.m. Third episode. Nonsmoker. EXAM: CHEST  2 VIEW COMPARISON:  05/01/2015 FINDINGS: The heart size and mediastinal contours are within normal limits. Both lungs are clear. The visualized skeletal structures are unremarkable. IMPRESSION: No active cardiopulmonary disease. Electronically Signed   By: Burman Nieves M.D.   On: 11/23/2015 02:08   US Abdomen Complete  11/23/2015  CLINICAL DATA:  Upper abdominal pain.  Recent vaginal delivery. EXAM: ABDOMEN ULTRASOUND COMPLETE COMPARISON:  None. FINDINGS: Gallbladder: Multiple small stones in the gallbladder layering along the dependent portion. No gallbladder wall thickening. Murphy's sign is negative. Common bile duct: Diameter: 2.9 mm, normal Liver: No focal lesion identified. Within normal limits in parenchymal echogenicity. IVC: No abnormality visualized. Pancreas: Visualized portion unremarkable. Spleen: Size and appearance within normal limits. Right Kidney: Length: 11 cm. Echogenicity within normal limits. No mass or hydronephrosis visualized. Left Kidney: Length: 11.2 cm.  Echogenicity within normal limits. No mass or hydronephrosis visualized. Abdominal aorta: No aneurysm visualized. Other findings: None. IMPRESSION: Cholelithiasis.  No additional findings to support cholecystitis. Electronically Signed   By: Burman Nieves M.D.   On: 11/23/2015 05:27    Physical Exam   Blood pressure 117/69, pulse 50, temperature  97.6 F (36.4 C), temperature source Oral, height  (1.651 m), weight 66.792 kg (147 lb 4 oz), SpO2 96 %, currently breastfeeding.  Physical Exam  Constitutional: She is oriented to person, place, and time. She appears well-developed and well-nourished.  HENT:  Head: Normocephalic.  Eyes: Pupils are equal, round, and reactive to light.  Neck: Normal range of motion.  Cardiovascular: Normal rate, regular rhythm, normal heart sounds and intact distal pulses.   Respiratory: Effort normal and breath sounds normal.  GI: Soft. Bowel sounds are normal. There is tenderness. There is no rebound.  Genitourinary:  Deferred pelvic - no pelvic complaints  Musculoskeletal: Normal range of motion.  Neurological: She is alert and oriented to person, place, and time.  Skin: Skin is warm and dry.  Psychiatric: She has a normal mood and affect. Her behavior is normal. Judgment and thought content normal.    MAU Course  Procedures BMP CMP Amylase Lipase CXR G.I. Cocktail Ibuprofen 800 mg - pain completely relieved after 1 hour of administration NPO Abdominal Complete U/S  Assessment and Plan  30 yo G2P2002 1 month postpartum Upper Abdominal Pain Epigastric Pain Upper Back Pain Gallstones  Discharge home with instructions on abdominal pain and gallstones Stat referral appt to general surgeon will be coordinated through [facility] - pt will be notified of appt time If pain worsens, [facility] to [facility] for immediate evaluation Keep 6 wks PP visit with Marlinda Mike, CNM in 2 wks  *Pateint expressed that she wanted to go home at 0410 since her pain was gone /  pt notified of elevated liver enzymes and the need to have an abdominal ultrasound; which requires 6 hours of her being NPO (of which she already has at this time) / ok to leave prior to exam, but not having exam now can delay the work-up and treatment plan of a GI provider d/t the length of time required NPO before abdominal u/s / verbalized understanding and desire to have abdominal ultrasound prior to leaving hospital today / Advised that if u/s positive for any abdominal dz such as gallstones, our office would make her a referral to a general surgeon ASAP; if the u/s is negative, our office would make referral to a GI provider.Understanding verbalized.  *Dr. Billy Coast notified of assessment and plan - agrees   Kenard Gower MSN, CNM 11/23/2015, 3:10 AM

## 2015-11-23 NOTE — MAU Note (Addendum)
PT SAYS SHE DEL VAG  ON  1-20-    BY  TANYA, CNM       SAYS HAS HAD 3 EPISODES  SINCE LAST WEEK -  STOMACH PAIN LEADS   BACK PAIN  THEN UP   THROUGH  HER CHEST.   SHE  CALLED  OFFICE LAST Thursday   SPOKE  TO ROLITTA,  CNM  -  TOLD  TO RAKE GAS-X-     WITH RELIEF.       TODAY - STARTED AT 9PM - SEVER PAIN X30 MIN   -   NO N/V.         NOW IN TRIAGE-   FEELS  BACK PAIN ON RIGHT  SIDE-  TOOK 2 GASX AND  2 TUMS  AND  NO RELIEF.   ATE LAST -   7PM    BREAST FEEDING.      DID NOT CALL   TONIGHT

## 2015-12-10 ENCOUNTER — Ambulatory Visit: Payer: Self-pay | Admitting: General Surgery

## 2015-12-10 NOTE — H&P (Signed)
Dana Anderson 11/27/2015 10:20 AM Location: [facility] Patient #: [id number] DOB: 07/09/1986 Undefined / Language: English / Race: White Female   History of Present Illness (Dana Anderson; 11/27/2015 10:56 AM) The patient is a 29 year old female.  Note:She is referred by Dana Anderson for consultation regarding upper abdominal pain that radiates to the back and cholelithiasis. She is 1 month postpartum. She has had 3 episodes of epigastric discomfort radiating through to the back. Last 2 episodes following eating cottage cheese. The first 2 episodes got better after taking Gas-X, however the third episode persisted despite this. She was seen at [facility]. Transaminases were elevated at that time. Ultrasound demonstrated cholelithiasis with no evidence of cholecystitis. Common bile duct diameter was normal. Her symptoms resolved while she was at [facility] and she was sent home and referred here since she's been watching her diet, she's had no other episodes of severe abdominal pain. She does occasionally have some right subscapular discomfort which seems to respond to Motrin. Her mother and grandmother both had their gallbladders removed. No jaundice. She is here with her husband and her newborn. She is breast-feeding.  Other Problems (Dana Anderson; 11/27/2015 10:20 AM) Cholelithiasis Hemorrhoids  Past Surgical History (Dana Anderson; 11/27/2015 10:20 AM) Oral Surgery Tonsillectomy  Diagnostic Studies History (Dana Anderson; 11/27/2015 10:20 AM) Colonoscopy never Mammogram never Pap Smear 1-5 years ago  Allergies (Dana Anderson; 11/27/2015 10:20 AM) No Known Drug Allergies02/21/2017  Medication History (Dana Anderson; 11/27/2015 10:21 AM) Ibuprofen (600MG Tablet, Oral) Active. Magnesium (250MG Tablet, Oral) Active. Folic Acid (1MG Tablet, Oral) Active. MiraLax (Oral) Active. Prenatal Multi Natural (Oral)  Active. Medications Reconciled  Social History (Dana Anderson; 11/27/2015 10:20 AM) Alcohol use Occasional alcohol use. Caffeine use Carbonated beverages, Coffee, Tea. No drug use Tobacco use Never smoker.  Family History (Dana Anderson; 11/27/2015 10:20 AM) Hypertension Dana Anderson. Melanoma Dana Anderson. Seizure disorder Dana Anderson.  Pregnancy / Birth History (Dana Anderson; 11/27/2015 10:20 AM) Age at menarche 13 years. Contraceptive History Oral contraceptives. Gravida 2 Maternal age 26-30 Para 2 Regular periods    Review of Systems (Dana Beck Anderson; 11/27/2015 10:20 AM) General Not Present- Appetite Loss, Chills, Fatigue, Fever, Night Sweats, Weight Gain and Weight Loss. Skin Not Present- Change in Wart/Mole, Dryness, Hives, Jaundice, New Lesions, Non-Healing Wounds, Rash and Ulcer. HEENT Not Present- Earache, Hearing Loss, Hoarseness, Nose Bleed, Oral Ulcers, Ringing in the Ears, Seasonal Allergies, Sinus Pain, Sore Throat, Visual Disturbances, Wears glasses/contact lenses and Yellow Eyes. Respiratory Not Present- Bloody sputum, Chronic Cough, Difficulty Breathing, Snoring and Wheezing. Breast Not Present- Breast Mass, Breast Pain, Nipple Discharge and Skin Changes. Cardiovascular Present- Chest Pain. Not Present- Difficulty Breathing Lying Down, Leg Cramps, Palpitations, Rapid Heart Rate, Shortness of Breath and Swelling of Extremities. Gastrointestinal Present- Abdominal Pain, Constipation and Hemorrhoids. Not Present- Bloating, Bloody Stool, Change in Bowel Habits, Chronic diarrhea, Difficulty Swallowing, Excessive gas, Gets full quickly at meals, Indigestion, Nausea, Rectal Pain and Vomiting. Musculoskeletal Present- Back Pain. Not Present- Joint Pain, Joint Stiffness, Muscle Pain, Muscle Weakness and Swelling of Extremities. Neurological Not Present- Decreased Memory, Fainting, Headaches, Numbness, Seizures, Tingling, Tremor, Trouble walking and Weakness. Psychiatric Not  Present- Anxiety, Bipolar, Change in Sleep Pattern, Depression, Fearful and Frequent crying. Endocrine Not Present- Cold Intolerance, Excessive Hunger, Hair Changes, Heat Intolerance, Hot flashes and New Diabetes. Hematology Not Present- Easy Bruising, Excessive bleeding, Gland problems, HIV and Persistent Infections.  Vitals (Dana Beck Anderson; 11/27/2015 10:22   AM) 11/27/2015 10:21 AM Weight: 143 lb Height: 66in Body Surface Area: 1.73 m Body Mass Index: 23.08 kg/m  Temp.: 97.6F(Temporal)  Pulse: 56 (Regular)  BP: 124/66 (Sitting, Left Arm, Standard)       Physical Exam (Dana Anderson; 11/27/2015 10:59 AM) The physical exam findings are as follows: Note:General: WDWN in NAD. Pleasant and cooperative.  HEENT: Dana Anderson, no facial masses  EYES: EOMI, no icterus  CV: RRR, no murmur, no JVD.  CHEST: Breath sounds equal and clear. Respirations nonlabored.  ABDOMEN: Soft, nontender, nondistended, no masses, no organomegaly, active bowel sounds, no scars, no hernias.  MUSCULOSKELETAL: no edema  SKIN: No jaundice or suspicious rashes.  NEUROLOGIC: Alert and oriented, answers questions appropriately.  PSYCHIATRIC: Normal mood, affect , and behavior.    Assessment & Plan (Dana Anderson; 11/27/2015 11:00 AM) SYMPTOMATIC CHOLELITHIASIS (K80.20) Impression: She has been asymptomatic on a limited diet which has been low fat. I advised her to continue this.  Plan: Laparoscopic cholecystectomy with cholangiogram. I have explained the procedure, risks, and aftercare of cholecystectomy. Risks include but are not limited to bleeding, infection, wound problems, anesthesia, diarrhea, bile leak, injury to common bile duct/liver/intestine. She seems to understand and agrees with the plan.  Dana Anderson 

## 2015-12-10 NOTE — Pre-Procedure Instructions (Signed)
Lelon HuhMeredith B Malcomb  12/10/2015     Your procedure is scheduled on Thursday, December 13, 2015 at 9:00 AM.  [facility] at 7:00 AM             (Posted Surgery time 9:00 am - 10:30 am)   Call this number if you have problems the morning of surgery: [phone]    Remember:  Do not eat food or drink liquids after midnight Wednesday.  Take these medicines the morning of surgery with A SIP OF WATER : Tylenol                          STOP taking any aspirin, vitamins/herbal supplements, Ibuprofen, Advil, Motrin, Aleve, etc today   Do not wear jewelry, make-up or nail polish.  Do not wear lotions, powders, or perfumes.  You may NOT wear deodorant the day of surgery.             Do not shave underarms & legs 48 hrs prior to surgery.             Do not bring valuables to the hospital.  [facility] is not responsible for any belongings or valuables.  Contacts, dentures or bridgework may not be worn into surgery.  Leave your suitcase in the car.  After surgery it may be brought to your room.  Patients discharged the day of surgery will not be allowed to drive home.   Name and phone number of your driver:     Please read over the following fact sheets that you were given. Pain Booklet and Surgical Site Infection Prevention

## 2015-12-10 NOTE — Progress Notes (Signed)
Nurse called Dr. Maris Bergerosenbower's office and spoke with Toniann FailWendy and informed her that patient did not have any orders in EPIC. Toniann FailWendy stated she would let Dr. Abbey Chattersosenbower know.

## 2015-12-10 NOTE — Pre-Procedure Instructions (Signed)
Dana Anderson  12/10/2015      [facility] [id number] G[place]ette Otto- [place], [place] - [address] [facility] [address] [facility] [address] [place] [id number] Phone: [phone] Fax: [phone]    Your procedure is scheduled on Thursday, March 9th   [facility] Admitting at 7:00 AM             (Posted Surgery time 9:00 am - 10:30 am)   Call this number if you have problems the morning of surgery:  [phone]   Remember:  Do not eat food or drink liquids after midnight Wednesday.  Take these medicines the morning of surgery with A SIP OF WATER : Tylenol                          4-5 days prior to surgery, STOP taking any anti-inflammatories, vitamins, herbal supplements.   Do not wear jewelry, make-up or nail polish.  Do not wear lotions, powders, or perfumes.  You may NOT wear deodorant the day of surgery.              Do not shave underarms & legs 48 hrs prior to surgery.             Do not bring valuables to the hospital.  [facility] is not responsible for any belongings or valuables.  Contacts, dentures or bridgework may not be worn into surgery.  Leave your suitcase in the car.  After surgery it may be brought to your room.  Patients discharged the day of surgery will not be allowed to drive home.   Name and phone number of your driver:     Please read over the following fact sheets that you were given. Pain Booklet and Surgical Site Infection Prevention

## 2015-12-11 ENCOUNTER — Encounter (HOSPITAL_COMMUNITY)
Admission: RE | Admit: 2015-12-11 | Discharge: 2015-12-11 | Disposition: A | Discharge status: Home / Self Care | Payer: BLUE CROSS/BLUE SHIELD | Source: Ambulatory Visit | Attending: General Surgery | Admitting: General Surgery

## 2015-12-11 ENCOUNTER — Encounter (HOSPITAL_COMMUNITY): Payer: Self-pay

## 2015-12-11 DIAGNOSIS — K8010 Calculus of gallbladder with chronic cholecystitis without obstruction: Secondary | ICD-10-CM | POA: Diagnosis present

## 2015-12-11 LAB — COMPREHENSIVE METABOLIC PANEL
ALT: 37 U/L (ref 14–54)
ANION GAP: 12 (ref 5–15)
AST: 31 U/L (ref 15–41)
Albumin: 4 g/dL (ref 3.5–5.0)
Alkaline Phosphatase: 77 U/L (ref 38–126)
BUN: 14 mg/dL (ref 6–20)
CHLORIDE: 106 mmol/L (ref 101–111)
CO2: 24 mmol/L (ref 22–32)
CREATININE: 0.77 mg/dL (ref 0.44–1.00)
Calcium: 9.4 mg/dL (ref 8.9–10.3)
Glucose, Bld: 84 mg/dL (ref 65–99)
Potassium: 4 mmol/L (ref 3.5–5.1)
SODIUM: 142 mmol/L (ref 135–145)
Total Bilirubin: 0.4 mg/dL (ref 0.3–1.2)
Total Protein: 6.5 g/dL (ref 6.5–8.1)

## 2015-12-11 LAB — CBC
HCT: 37 % (ref 36.0–46.0)
Hemoglobin: 12.4 g/dL (ref 12.0–15.0)
MCH: 29.1 pg (ref 26.0–34.0)
MCHC: 33.5 g/dL (ref 30.0–36.0)
MCV: 86.9 fL (ref 78.0–100.0)
PLATELETS: 269 10*3/uL (ref 150–400)
RBC: 4.26 MIL/uL (ref 3.87–5.11)
RDW: 13.5 % (ref 11.5–15.5)
WBC: 6.6 10*3/uL (ref 4.0–10.5)

## 2015-12-11 LAB — HCG, SERUM, QUALITATIVE: PREG SERUM: NEGATIVE

## 2015-12-12 MED ORDER — CEFAZOLIN SODIUM-DEXTROSE 2-3 GM-% IV SOLR
2.0000 g | INTRAVENOUS | Status: AC
  Administered 2015-12-13: 2 g via INTRAVENOUS
  Filled 2015-12-12: qty 50

## 2015-12-13 ENCOUNTER — Encounter (HOSPITAL_COMMUNITY): Payer: Self-pay | Admitting: *Deleted

## 2015-12-13 ENCOUNTER — Ambulatory Visit (HOSPITAL_COMMUNITY): Payer: BLUE CROSS/BLUE SHIELD | Admitting: Certified Registered"

## 2015-12-13 ENCOUNTER — Encounter (HOSPITAL_COMMUNITY)
Admission: RE | Disposition: A | Discharge status: Home / Self Care | Payer: Self-pay | Source: Ambulatory Visit | Attending: General Surgery

## 2015-12-13 ENCOUNTER — Ambulatory Visit (HOSPITAL_COMMUNITY)
Admission: RE | Admit: 2015-12-13 | Discharge: 2015-12-13 | Disposition: A | Discharge status: Home / Self Care | Payer: BLUE CROSS/BLUE SHIELD | Source: Ambulatory Visit | Attending: General Surgery | Admitting: General Surgery

## 2015-12-13 ENCOUNTER — Ambulatory Visit (HOSPITAL_COMMUNITY): Payer: BLUE CROSS/BLUE SHIELD

## 2015-12-13 DIAGNOSIS — Z419 Encounter for procedure for purposes other than remedying health state, unspecified: Secondary | ICD-10-CM

## 2015-12-13 DIAGNOSIS — K8010 Calculus of gallbladder with chronic cholecystitis without obstruction: Secondary | ICD-10-CM | POA: Insufficient documentation

## 2015-12-13 HISTORY — PX: CHOLECYSTECTOMY: SHX55

## 2015-12-13 SURGERY — LAPAROSCOPIC CHOLECYSTECTOMY WITH INTRAOPERATIVE CHOLANGIOGRAM
Anesthesia: General | Site: Abdomen

## 2015-12-13 MED ORDER — HYDROCODONE-ACETAMINOPHEN 5-325 MG PO TABS: 1.0000 | ORAL_TABLET | ORAL | Status: DC | PRN

## 2015-12-13 MED ORDER — LIDOCAINE HCL (CARDIAC) 20 MG/ML IV SOLN
INTRAVENOUS | Status: AC
  Filled 2015-12-13: qty 5

## 2015-12-13 MED ORDER — KETOROLAC TROMETHAMINE 30 MG/ML IJ SOLN
INTRAMUSCULAR | Status: AC
  Filled 2015-12-13: qty 1

## 2015-12-13 MED ORDER — LACTATED RINGERS IV SOLN
INTRAVENOUS | Status: DC
  Administered 2015-12-13 (×3): via INTRAVENOUS

## 2015-12-13 MED ORDER — BUPIVACAINE-EPINEPHRINE 0.25% -1:200000 IJ SOLN
INTRAMUSCULAR | Status: DC | PRN
  Administered 2015-12-13: 15 mL

## 2015-12-13 MED ORDER — KETOROLAC TROMETHAMINE 30 MG/ML IJ SOLN
INTRAMUSCULAR | Status: DC | PRN
  Administered 2015-12-13: 30 mg via INTRAVENOUS

## 2015-12-13 MED ORDER — SUCCINYLCHOLINE CHLORIDE 20 MG/ML IJ SOLN
INTRAMUSCULAR | Status: DC | PRN
  Administered 2015-12-13: 120 mg via INTRAVENOUS

## 2015-12-13 MED ORDER — DEXAMETHASONE SODIUM PHOSPHATE 10 MG/ML IJ SOLN
INTRAMUSCULAR | Status: DC | PRN
  Administered 2015-12-13: 8 mg via INTRAVENOUS

## 2015-12-13 MED ORDER — DEXAMETHASONE SODIUM PHOSPHATE 4 MG/ML IJ SOLN
INTRAMUSCULAR | Status: AC
  Filled 2015-12-13: qty 2

## 2015-12-13 MED ORDER — 0.9 % SODIUM CHLORIDE (POUR BTL) OPTIME
TOPICAL | Status: DC | PRN
  Administered 2015-12-13: 1000 mL

## 2015-12-13 MED ORDER — NEOSTIGMINE METHYLSULFATE 10 MG/10ML IV SOLN
INTRAVENOUS | Status: DC | PRN
Start: 2015-12-13 — End: 2015-12-13
  Administered 2015-12-13: 4 mg via INTRAVENOUS

## 2015-12-13 MED ORDER — LACTATED RINGERS IV SOLN: INTRAVENOUS | Status: DC

## 2015-12-13 MED ORDER — ROCURONIUM BROMIDE 50 MG/5ML IV SOLN
INTRAVENOUS | Status: AC
  Filled 2015-12-13: qty 1

## 2015-12-13 MED ORDER — MIDAZOLAM HCL 2 MG/2ML IJ SOLN
INTRAMUSCULAR | Status: AC
Start: 2015-12-13 — End: 2015-12-13
  Filled 2015-12-13: qty 2

## 2015-12-13 MED ORDER — ROCURONIUM BROMIDE 100 MG/10ML IV SOLN
INTRAVENOUS | Status: DC | PRN
  Administered 2015-12-13: 20 mg via INTRAVENOUS
  Administered 2015-12-13: 30 mg via INTRAVENOUS

## 2015-12-13 MED ORDER — HYDROMORPHONE HCL 1 MG/ML IJ SOLN
INTRAMUSCULAR | Status: AC
  Filled 2015-12-13: qty 1

## 2015-12-13 MED ORDER — PROPOFOL 10 MG/ML IV BOLUS
INTRAVENOUS | Status: DC | PRN
  Administered 2015-12-13: 170 mg via INTRAVENOUS

## 2015-12-13 MED ORDER — ONDANSETRON HCL 4 MG PO TABS
4.0000 mg | ORAL_TABLET | Freq: Once | ORAL | Status: AC
  Administered 2015-12-13: 4 mg via ORAL
  Filled 2015-12-13: qty 1

## 2015-12-13 MED ORDER — BUPIVACAINE-EPINEPHRINE (PF) 0.25% -1:200000 IJ SOLN
INTRAMUSCULAR | Status: AC
  Filled 2015-12-13: qty 30

## 2015-12-13 MED ORDER — SODIUM CHLORIDE 0.9 % IV SOLN
INTRAVENOUS | Status: DC | PRN
  Administered 2015-12-13: 6 mL

## 2015-12-13 MED ORDER — FENTANYL CITRATE (PF) 100 MCG/2ML IJ SOLN
INTRAMUSCULAR | Status: DC | PRN
  Administered 2015-12-13: 100 ug via INTRAVENOUS
  Administered 2015-12-13 (×3): 50 ug via INTRAVENOUS

## 2015-12-13 MED ORDER — LIDOCAINE HCL (CARDIAC) 20 MG/ML IV SOLN
INTRAVENOUS | Status: DC | PRN
  Administered 2015-12-13: 60 mg via INTRAVENOUS

## 2015-12-13 MED ORDER — SODIUM CHLORIDE 0.9 % IR SOLN
Status: DC | PRN
  Administered 2015-12-13: 1000 mL

## 2015-12-13 MED ORDER — FENTANYL CITRATE (PF) 250 MCG/5ML IJ SOLN
INTRAMUSCULAR | Status: AC
  Filled 2015-12-13: qty 5

## 2015-12-13 MED ORDER — ONDANSETRON HCL 4 MG PO TABS: 4.0000 mg | ORAL_TABLET | ORAL | Status: DC | PRN

## 2015-12-13 MED ORDER — HYDROMORPHONE HCL 1 MG/ML IJ SOLN
0.2500 mg | INTRAMUSCULAR | Status: DC | PRN
  Administered 2015-12-13 (×2): 0.5 mg via INTRAVENOUS

## 2015-12-13 MED ORDER — GLYCOPYRROLATE 0.2 MG/ML IJ SOLN
INTRAMUSCULAR | Status: DC | PRN
  Administered 2015-12-13: 0.6 mg via INTRAVENOUS

## 2015-12-13 MED ORDER — PROPOFOL 10 MG/ML IV BOLUS
INTRAVENOUS | Status: AC
  Filled 2015-12-13: qty 20

## 2015-12-13 SURGICAL SUPPLY — 47 items
APPLIER CLIP 5 13 M/L LIGAMAX5 (MISCELLANEOUS) ×2
BENZOIN TINCTURE PRP APPL 2/3 (GAUZE/BANDAGES/DRESSINGS) ×2 IMPLANT
CANISTER SUCTION 2500CC (MISCELLANEOUS) ×2 IMPLANT
CATH REDDICK CHOLANGI 4FR 50CM (CATHETERS) ×2 IMPLANT
CHLORAPREP W/TINT 26ML (MISCELLANEOUS) ×2 IMPLANT
CLIP APPLIE 5 13 M/L LIGAMAX5 (MISCELLANEOUS) ×1 IMPLANT
CLSR STERI-STRIP ANTIMIC 1/2X4 (GAUZE/BANDAGES/DRESSINGS) ×2 IMPLANT
COVER MAYO STAND STRL (DRAPES) ×2 IMPLANT
COVER SURGICAL LIGHT HANDLE (MISCELLANEOUS) ×2 IMPLANT
DECANTER SPIKE VIAL GLASS SM (MISCELLANEOUS) IMPLANT
DRAPE C-ARM 42X72 X-RAY (DRAPES) ×2 IMPLANT
DRSG TEGADERM 2-3/8X2-3/4 SM (GAUZE/BANDAGES/DRESSINGS) ×6 IMPLANT
ELECT REM PT RETURN 9FT ADLT (ELECTROSURGICAL) ×2
ELECTRODE REM PT RTRN 9FT ADLT (ELECTROSURGICAL) ×1 IMPLANT
GAUZE SPONGE 2X2 8PLY STRL LF (GAUZE/BANDAGES/DRESSINGS) ×1 IMPLANT
GLOVE BIO SURGEON STRL SZ7 (GLOVE) ×2 IMPLANT
GLOVE BIOGEL PI IND STRL 7.0 (GLOVE) ×3 IMPLANT
GLOVE BIOGEL PI IND STRL 7.5 (GLOVE) ×1 IMPLANT
GLOVE BIOGEL PI IND STRL 8 (GLOVE) ×1 IMPLANT
GLOVE BIOGEL PI INDICATOR 7.0 (GLOVE) ×3
GLOVE BIOGEL PI INDICATOR 7.5 (GLOVE) ×1
GLOVE BIOGEL PI INDICATOR 8 (GLOVE) ×1
GLOVE ECLIPSE 8.0 STRL XLNG CF (GLOVE) ×2 IMPLANT
GLOVE SURG SS PI 6.5 STRL IVOR (GLOVE) ×4 IMPLANT
GLOVE SURG SS PI 7.0 STRL IVOR (GLOVE) ×4 IMPLANT
GOWN STRL REUS W/ TWL LRG LVL3 (GOWN DISPOSABLE) ×3 IMPLANT
GOWN STRL REUS W/TWL LRG LVL3 (GOWN DISPOSABLE) ×3
IV CATH 14GX2 1/4 (CATHETERS) ×4 IMPLANT
KIT BASIN OR (CUSTOM PROCEDURE TRAY) ×2 IMPLANT
KIT ROOM TURNOVER OR (KITS) ×2 IMPLANT
NS IRRIG 1000ML POUR BTL (IV SOLUTION) ×2 IMPLANT
PAD ARMBOARD 7.5X6 YLW CONV (MISCELLANEOUS) ×2 IMPLANT
POUCH SPECIMEN RETRIEVAL 10MM (ENDOMECHANICALS) ×2 IMPLANT
SCISSORS LAP 5X35 DISP (ENDOMECHANICALS) ×2 IMPLANT
SET IRRIG TUBING LAPAROSCOPIC (IRRIGATION / IRRIGATOR) ×2 IMPLANT
SLEEVE ENDOPATH XCEL 5M (ENDOMECHANICALS) ×4 IMPLANT
SPECIMEN JAR SMALL (MISCELLANEOUS) ×2 IMPLANT
SPONGE GAUZE 2X2 STER 10/PKG (GAUZE/BANDAGES/DRESSINGS) ×1
STRIP CLOSURE SKIN 1/2X4 (GAUZE/BANDAGES/DRESSINGS) ×2 IMPLANT
SUT MON AB 4-0 PC3 18 (SUTURE) ×2 IMPLANT
TOWEL OR 17X24 6PK STRL BLUE (TOWEL DISPOSABLE) ×2 IMPLANT
TOWEL OR 17X26 10 PK STRL BLUE (TOWEL DISPOSABLE) ×2 IMPLANT
TRAY LAPAROSCOPIC MC (CUSTOM PROCEDURE TRAY) ×2 IMPLANT
TROCAR XCEL BLUNT TIP 100MML (ENDOMECHANICALS) ×2 IMPLANT
TROCAR XCEL NON-BLD 11X100MML (ENDOMECHANICALS) IMPLANT
TROCAR XCEL NON-BLD 5MMX100MML (ENDOMECHANICALS) ×2 IMPLANT
TUBING INSUFFLATION (TUBING) ×2 IMPLANT

## 2015-12-13 NOTE — Discharge Instructions (Signed)
LAPAROSCOPIC SURGERY: POST OP INSTRUCTIONS ° °1. DIET: Follow a liquid diet the first 48 hours after arrival home, such as soup, liquids, crackers, etc.  Be sure to include lots of fluids daily.  Avoid fast food or heavy meals as your are more likely to get nauseated.  Eat a low fat the next few days after surgery.   °2. Take your usually prescribed home medications unless otherwise directed. °3. PAIN CONTROL: °a. Pain is best controlled by a usual combination of three different methods TOGETHER: °i. Ice/Heat °ii. Over the counter pain medication °iii. Prescription pain medication °b. Most patients will experience some swelling and bruising around the incisions.  Ice packs or heating pads (30-60 minutes up to 6 times a day) will help. Use ice for the first few days to help decrease swelling and bruising, then switch to heat to help relax tight/sore spots and speed recovery.  Some people prefer to use ice alone, heat alone, alternating between ice & heat.  Experiment to what works for you.  Swelling and bruising can take several weeks to resolve.   °c. It is helpful to take an over-the-counter pain medication regularly for the first few weeks.  Choose one of the following that works best for you: °i. Naproxen (Aleve, etc)  Two 220mg tabs twice a day °ii. Ibuprofen (Advil, etc) Three 200mg tabs four times a day (every meal & bedtime) °iii. Acetaminophen (Tylenol, etc) 500-650mg four times a day (every meal & bedtime) °d. A  prescription for pain medication (such as oxycodone, hydrocodone, etc) should be given to you upon discharge.  Take your pain medication as prescribed.  °i. If you are having problems/concerns with the prescription medicine (does not control pain, nausea, vomiting, rash, itching, etc), please call us [phone] to see if we need to switch you to a different pain medicine that will work better for you and/or control your side effect better. °ii. If you need a refill on your pain medication,  please contact your pharmacy.  They will contact our office to request authorization. Prescriptions will not be filled after 5 pm or on week-ends. °4. Avoid getting constipated.  Between the surgery and the pain medications, it is common to experience some constipation.  Increasing fluid intake and taking a fiber supplement (such as Metamucil, Citrucel, FiberCon, MiraLax, etc) 1-2 times a day regularly will usually help prevent this problem from occurring.  A mild laxative (prune juice, Milk of Magnesia, MiraLax, etc) should be taken according to package directions if there are no bowel movements after 48 hours.   °5. Watch out for diarrhea.  If you have many loose bowel movements, simplify your diet to bland foods & liquids for a few days.  Stop any stool softeners and decrease your fiber supplement.  Switching to mild anti-diarrheal medications (Kayopectate, Pepto Bismol) can help.  If this worsens or does not improve, please call us. °6. Wash / shower every day.  You may shower over the dressings as they are waterproof.  Continue to shower over incision(s) after the dressing is off. °7. Remove your waterproof bandages 3 days after surgery.  You may leave the incision open to air.  You may replace a dressing/Band-Aid to cover the incision for comfort if you wish.  °8. ACTIVITIES as tolerated:   °a. You may resume regular (light) daily activities beginning the next day--such as daily self-care, walking, climbing stairs--gradually increasing light activities as tolerated.  No heavy lifting (over 10 pounds), straining, or intense   activities for 2 weeks. °b. DO NOT PUSH THROUGH PAIN.  Let pain be your guide: If it hurts to do something, don't do it.  Pain is your body warning you to avoid that activity for another week until the pain goes down. °c. You may drive when you are no longer taking prescription pain medication, you can comfortably wear a seatbelt, and you can safely maneuver your car and apply  brakes. °d. You may have sexual intercourse when it is comfortable.  °9. FOLLOW UP in our office °a. Please call [facility] at [phone] to set up an appointment to see your surgeon in the office for a follow-up appointment approximately 2-3 weeks after your surgery. °b. Make sure that you call for this appointment the day you arrive home to insure a convenient appointment time. °10. IF YOU HAVE DISABILITY OR FAMILY LEAVE FORMS, BRING THEM TO THE OFFICE FOR PROCESSING.  DO NOT GIVE THEM TO YOUR DOCTOR. ° °11.  Return to work/school:  Desk work/light activities in 5-7 days, full duty/activities in 2 weeks if pain-free. ° ° °WHEN TO CALL US [phone]: °1. Poor pain control °2. Reactions / problems with new medications (rash/itching, nausea, etc)  °3. Fever over 101.5 F (38.5 C) °4. Inability to urinate °5. Nausea and/or vomiting °6. Worsening swelling or bruising °7. Continued bleeding from incision. °8. Increased pain, redness, or drainage from the incision ° ° The clinic staff is available to answer your questions during regular business hours (8:30am-5pm).  Please don’t hesitate to call and ask to speak to one of our nurses for clinical concerns.  ° If you have a medical emergency, go to the nearest emergency room or call 911. ° A surgeon from [facility] is always on call at the hospitals ° ° °[facility] °[address], [address], [place], [place]  [address] ? °MAIN: [phone] ? TOLL FREE: [phone] ?  °FAX [phone] °[email or web address] ° ° °

## 2015-12-13 NOTE — Anesthesia Procedure Notes (Signed)
Procedure Name: Intubation Date/Time: 12/13/2015 8:53 AM Performed by: Dorie RankQUINN, Samrat Hayward M Pre-anesthesia Checklist: Patient identified, Emergency Drugs available, Suction available, Patient being monitored and Timeout performed Patient Re-evaluated:Patient Re-evaluated prior to inductionOxygen Delivery Method: Circle system utilized Preoxygenation: Pre-oxygenation with 100% oxygen Intubation Type: IV induction Ventilation: Mask ventilation without difficulty Laryngoscope Size: Mac and 3 Grade View: Grade I Tube type: Oral Tube size: 7.0 mm Number of attempts: 1 Airway Equipment and Method: Stylet Placement Confirmation: ETT inserted through vocal cords under direct vision,  positive ETCO2 and breath sounds checked- equal and bilateral Secured at: 21 cm Tube secured with: Tape Dental Injury: Teeth and Oropharynx as per pre-operative assessment

## 2015-12-13 NOTE — H&P (View-Only) (Signed)
Dana Anderson 11/27/2015 10:20 AM Location: [facility] Patient #: [id number] DOB: April 10, 1986 Undefined / Language: [place] / Race: White Female   History of Present Illness [name](Romello Hoehn J. Kelis Plasse MD; 11/27/2015 10:56 AM) The patient is a 30 year old female.  Note:She is referred by Marlinda Mikeanya Bailey, CNM for consultation regarding upper abdominal pain that radiates to the back and cholelithiasis. She is 1 month postpartum. She has had 3 episodes of epigastric discomfort radiating through to the back. Last 2 episodes following eating cottage cheese. The first 2 episodes got better after taking Gas-X, however the third episode persisted despite this. [facility]. Transaminases were elevated at that time. Ultrasound demonstrated cholelithiasis with no evidence of cholecystitis. Common bile duct diameter was normal. Her symptoms [facility] and she was sent home and referred here since she's been watching her diet, she's had no other episodes of severe abdominal pain. She does occasionally have some right subscapular discomfort which seems to respond to Motrin. Her mother and grandmother both had their gallbladders removed. No jaundice. She is here with her husband and her newborn. She is breast-feeding.  Other Problems Fay Records(Ashley Beck, CMA; 11/27/2015 10:20 AM) Cholelithiasis Hemorrhoids  Past Surgical History Fay Records(Ashley Beck, CMA; 11/27/2015 10:20 AM) Oral Surgery Tonsillectomy  Diagnostic Studies History Fay Records(Ashley Beck, [place]; 11/27/2015 10:20 AM) Colonoscopy never Mammogram never Pap Smear 1-5 years ago  Allergies Fay Records(Ashley Beck, CMA; 11/27/2015 10:20 AM) No Known Drug Allergies02/21/2017  Medication History Fay Records(Ashley Beck, CMA; 11/27/2015 10:21 AM) Ibuprofen (600MG  Tablet, Oral) Active. Magnesium (250MG  Tablet, Oral) Active. Folic Acid (1MG  Tablet, Oral) Active. MiraLax (Oral) Active. Prenatal Multi Natural (Oral)  Active. Medications Reconciled  Social History Fay Records(Ashley Beck, [place]; 11/27/2015 10:20 AM) Alcohol use Occasional alcohol use. Caffeine use Carbonated beverages, Coffee, Tea. No drug use Tobacco use Never smoker.  Family History Fay Records(Ashley Beck, [place]; 11/27/2015 10:20 AM) Hypertension Father. Melanoma Father. Seizure disorder Son.  Pregnancy / Birth History Fay Records(Ashley Beck, CMA; 11/27/2015 10:20 AM) Age at menarche 13 years. Contraceptive History Oral contraceptives. Gravida 2 Maternal age 30-30 Para 2 Regular periods    Review of Systems Fay Records(Ashley Beck CMA; 11/27/2015 10:20 AM) General Not Present- Appetite Loss, Chills, Fatigue, Fever, Night Sweats, Weight Gain and Weight Loss. Skin Not Present- Change in Wart/Mole, Dryness, Hives, Jaundice, New Lesions, Non-Healing Wounds, Rash and Ulcer. HEENT Not Present- Earache, Hearing Loss, Hoarseness, Nose Bleed, Oral Ulcers, Ringing in the Ears, Seasonal Allergies, Sinus Pain, Sore Throat, Visual Disturbances, Wears glasses/contact lenses and Yellow Eyes. Respiratory Not Present- Bloody sputum, Chronic Cough, Difficulty Breathing, Snoring and Wheezing. Breast Not Present- Breast Mass, Breast Pain, Nipple Discharge and Skin Changes. Cardiovascular Present- Chest Pain. Not Present- Difficulty Breathing Lying Down, Leg Cramps, Palpitations, Rapid Heart Rate, Shortness of Breath and Swelling of Extremities. Gastrointestinal Present- Abdominal Pain, Constipation and Hemorrhoids. Not Present- Bloating, Bloody Stool, Change in Bowel Habits, Chronic diarrhea, Difficulty Swallowing, Excessive gas, Gets full quickly at meals, Indigestion, Nausea, Rectal Pain and Vomiting. Musculoskeletal Present- Back Pain. Not Present- Joint Pain, Joint Stiffness, Muscle Pain, Muscle Weakness and Swelling of Extremities. Neurological Not Present- Decreased Memory, Fainting, Headaches, Numbness, Seizures, Tingling, Tremor, Trouble walking and Weakness. Psychiatric Not  Present- Anxiety, Bipolar, Change in Sleep Pattern, Depression, Fearful and Frequent crying. Endocrine Not Present- Cold Intolerance, Excessive Hunger, Hair Changes, Heat Intolerance, Hot flashes and New Diabetes. Hematology Not Present- Easy Bruising, Excessive bleeding, Gland problems, HIV and Persistent Infections.  Vitals Fay Records(Ashley Beck CMA; 11/27/2015 10:22  AM) 11/27/2015 10:21 AM Weight: 143 lb Height: 66in Body Surface Area: 1.73 m Body Mass Index: 23.08 kg/m  Temp.: 97.1F(Temporal)  Pulse: 56 (Regular)  BP: 124/66 (Sitting, Left Arm, Standard)       Physical Exam [name] MD; 11/27/2015 10:59 AM) The physical exam findings are as follows: Note:General: WDWN in NAD. Pleasant and cooperative.  HEENT: [place]/AT, no facial masses  EYES: EOMI, no icterus  CV: RRR, no murmur, no JVD.  CHEST: Breath sounds equal and clear. Respirations nonlabored.  ABDOMEN: Soft, nontender, nondistended, no masses, no organomegaly, active bowel sounds, no scars, no hernias.  MUSCULOSKELETAL: no edema  SKIN: No jaundice or suspicious rashes.  NEUROLOGIC: Alert and oriented, answers questions appropriately.  PSYCHIATRIC: Normal mood, affect , and behavior.    Assessment & Plan [name] MD; 11/27/2015 11:00 AM) SYMPTOMATIC CHOLELITHIASIS (K80.20) Impression: She has been asymptomatic on a limited diet which has been low fat. I advised her to continue this.  Plan: Laparoscopic cholecystectomy with cholangiogram. I have explained the procedure, risks, and aftercare of cholecystectomy. Risks include but are not limited to bleeding, infection, wound problems, anesthesia, diarrhea, bile leak, injury to common bile duct/liver/intestine. She seems to understand and agrees with the plan.  Avel Peace, MD

## 2015-12-13 NOTE — Transfer of Care (Signed)
Immediate Anesthesia Transfer of Care Note  Patient: Dana Anderson  Procedure(s) Performed: Procedure(s): LAPAROSCOPIC CHOLECYSTECTOMY WITH INTRAOPERATIVE CHOLANGIOGRAM (N/A)  Patient Location: PACU  Anesthesia Type:General  Level of Consciousness: awake, alert  and oriented  Airway & Oxygen Therapy: Patient connected to face mask oxygen  Post-op Assessment: Report given to RN  Post vital signs: stable  Last Vitals:  Filed Vitals:   12/13/15 0721  BP: 102/70  Pulse: 52  Temp: 36.7 C  Resp: 16    Complications: No apparent anesthesia complications

## 2015-12-13 NOTE — Anesthesia Postprocedure Evaluation (Signed)
Anesthesia Post Note  Patient: Dana Anderson  Procedure(s) Performed: Procedure(s) (LRB): LAPAROSCOPIC CHOLECYSTECTOMY WITH INTRAOPERATIVE CHOLANGIOGRAM (N/A)  Patient location during evaluation: PACU Anesthesia Type: General Level of consciousness: awake and alert Pain management: pain level controlled Vital Signs Assessment: post-procedure vital signs reviewed and stable Respiratory status: spontaneous breathing, nonlabored ventilation, respiratory function stable and patient connected to nasal cannula oxygen Cardiovascular status: blood pressure returned to baseline and stable Postop Assessment: no signs of nausea or vomiting Anesthetic complications: no    Last Vitals:  Filed Vitals:   12/13/15 1122 12/13/15 1123  BP: 112/72   Pulse: 49 50  Temp:    Resp:  10    Last Pain:  Filed Vitals:   12/13/15 1125  PainSc: 5                  Mclean Moya L

## 2015-12-13 NOTE — Op Note (Signed)
OPERATIVE NOTE-LAPAROSCOPIC CHOLECYSTECTOMY  Preoperative diagnosis:  Symptomatic cholelithiasis  Postoperative diagnosis:  Same    Procedure: Laparoscopic cholecystectomy with cholangiogram.  Surgeon: Avel Peaceodd Heith Haigler, M.D.  Asst.:  Marcille BlancoMatt Tsuei, MD  Anesthesia: General  Indication:   This is s 30 year old female who is postpartum and developed symptomatic cholelithiasis.  She presents now for elective cholecystectomy.  Her LFTs were elevated one month ago but are normal now.  Technique: She was brought to the operating room, placed supine on the operating table, and a general anesthetic was administered.  The abdominal wall was then sterilely prepped and draped. Local anesthetic (Marcaine) was infiltrated in the subumbilical region. A small subumbilical incision was made through the skin, subcutaneous tissue, fascia, and peritoneum entering the peritoneal cavity under direct vision. A pursestring suture of 0 Vicryl was placed around the edges of the fascia. A Hassan trocar was introduced into the peritoneal cavity and a pneumoperitoneum was created by insufflation of carbon dioxide gas. The laparoscope was introduced into the trocar and no underlying bleeding or organ injury was noted. He/She was then placed in the reverse Trendelenburg position with the right side tilted slightly up.  Three 5 mm trocars were then placed into the abdominal cavity under laparoscopic vision. One in the epigastric area, and 2 in the right upper quadrant area. The gallbladder was visualized and the fundus was grasped and retracted toward the right shoulder. No significant adhesions were noted. The infundibulum was mobilized with dissection close to the gallbladder and retracted laterally. The cystic duct was identified and a window was created around it. An anterior branch and posterior branch of the cystic artery were identified and isolated. The anterior branch of the cystic artery was clipped and divided. The  critical view was achieved. A clip was placed at the neck of the gallbladder. A small incision was made in the cystic duct. Multiple small, soft gallstones were milked out of the distal cystic duct and removed from the peritoneal cavity.  A cholangiocatheter was introduced through the anterior abdominal wall and placed in the cystic duct. A intraoperative cholangiogram was then performed.  Under real-time fluoroscopy, dilute contrast was injected into the cystic duct.  The common hepatic duct, the right and left hepatic ducts, and the common duct were all visualized. Contrast drained into the duodenum without obvious evidence of any obstructing ductal lesion. The final report is pending the Radiologist's interpretation.  The cholangiocatheter was removed, the cystic duct was clipped 3 times on the biliary side, and then the cystic duct was divided sharply. No bile leak was noted from the cystic duct stump.  The posterior branch of the cystic artery was then clipped and divided. Following this the gallbladder was dissected free from the liver using electrocautery. The gallbladder was then placed in a retrieval bag and removed from the abdominal cavity through the subumbilical incision.  The gallbladder fossa was inspected, irrigated, and bleeding was controlled with electrocautery. Inspection showed that hemostasis was adequate and there was no evidence of bile leak.  The irrigation fluid was evacuated as much as possible.  The subumbilical trocar was removed and the fascial defect was closed by tightening and tying down the pursestring suture under laparoscopic vision.  The remaining trocars were removed and the pneumoperitoneum was released. The skin incisions were closed with 4-0 Monocryl subcuticular stitches. Steri-Strips and sterile dressings were applied.  The procedure was well-tolerated without any apparent complications. She was taken to the recovery room in satisfactory condition.

## 2015-12-13 NOTE — Anesthesia Preprocedure Evaluation (Addendum)
Anesthesia Evaluation  Patient identified by MRN, date of birth, ID band Patient awake    Reviewed: Allergy & Precautions, H&P , NPO status , Patient's Chart, lab work & pertinent test results  Airway Mallampati: II  TM Distance: >3 FB Neck ROM: full    Dental no notable dental hx. (+) Dental Advisory Given, Teeth Intact   Pulmonary neg pulmonary ROS,    Pulmonary exam normal breath sounds clear to auscultation       Cardiovascular Exercise Tolerance: Good negative cardio ROS Normal cardiovascular exam Rhythm:regular Rate:Normal     Neuro/Psych negative neurological ROS  negative psych ROS   GI/Hepatic negative GI ROS, Neg liver ROS,   Endo/Other  negative endocrine ROS  Renal/GU negative Renal ROS  negative genitourinary   Musculoskeletal negative musculoskeletal ROS (+)   Abdominal (+)  Abdomen: soft. Bowel sounds: normal.  Peds negative pediatric ROS (+)  Hematology negative hematology ROS (+)   Anesthesia Other Findings Pt breastfeeding, understands to pump and waste breast milk for 24 hrs.   Reproductive/Obstetrics negative OB ROS                          Anesthesia Physical Anesthesia Plan  ASA: I  Anesthesia Plan: General   Post-op Pain Management:    Induction: Intravenous  Airway Management Planned: Oral ETT  Additional Equipment:   Intra-op Plan:   Post-operative Plan: Extubation in OR  Informed Consent: I have reviewed the patients History and Physical, chart, labs and discussed the procedure including the risks, benefits and alternatives for the proposed anesthesia with the patient or authorized representative who has indicated his/her understanding and acceptance.   Dental Advisory Given  Plan Discussed with: CRNA and Surgeon  Anesthesia Plan Comments:         Anesthesia Quick Evaluation

## 2015-12-13 NOTE — Interval H&P Note (Signed)
History and Physical Interval Note:  12/13/2015 8:20 AM  Dana Anderson  has presented today for surgery, with the diagnosis of Gallstones  The various methods of treatment have been discussed with the patient and family. After consideration of risks, benefits and other options for treatment, the patient has consented to  Procedure(s): LAPAROSCOPIC CHOLECYSTECTOMY WITH INTRAOPERATIVE CHOLANGIOGRAM (N/A) as a surgical intervention .  The patient's history has been reviewed, patient examined, no change in status, stable for surgery.  I have reviewed the patient's chart and labs.  Questions were answered to the patient's satisfaction.     Kallan Merrick JShela Commons

## 2015-12-14 ENCOUNTER — Encounter (HOSPITAL_COMMUNITY): Payer: Self-pay | Admitting: General Surgery

## 2016-01-28 ENCOUNTER — Telehealth (HOSPITAL_COMMUNITY): Payer: Self-pay

## 2016-01-28 NOTE — Telephone Encounter (Signed)
Left message for patient to call if assistance needed.

## 2016-01-31 ENCOUNTER — Telehealth (HOSPITAL_COMMUNITY): Payer: Self-pay | Admitting: Lactation Services

## 2016-01-31 NOTE — Telephone Encounter (Signed)
Mom called and I called her back with concerns about being back to work and decreasing milk supply. Mom pumping about 12 oz while at work and baby is taking 20 oz- using up all her supply. Suggested Mother's Milk Tea to increase supply and power pumping. Suggested pumping for 20 min each time even though she is not getting any milk for more stimulation. Will try these things and call back as needed.

## 2017-11-03 DIAGNOSIS — Z1151 Encounter for screening for human papillomavirus (HPV): Secondary | ICD-10-CM | POA: Diagnosis not present

## 2017-11-03 DIAGNOSIS — Z01419 Encounter for gynecological examination (general) (routine) without abnormal findings: Secondary | ICD-10-CM | POA: Diagnosis not present

## 2017-11-03 DIAGNOSIS — Z6821 Body mass index (BMI) 21.0-21.9, adult: Secondary | ICD-10-CM | POA: Diagnosis not present

## 2018-03-30 DIAGNOSIS — L820 Inflamed seborrheic keratosis: Secondary | ICD-10-CM | POA: Diagnosis not present

## 2018-03-30 DIAGNOSIS — D225 Melanocytic nevi of trunk: Secondary | ICD-10-CM | POA: Diagnosis not present

## 2018-03-30 DIAGNOSIS — D1801 Hemangioma of skin and subcutaneous tissue: Secondary | ICD-10-CM | POA: Diagnosis not present

## 2018-03-30 DIAGNOSIS — L821 Other seborrheic keratosis: Secondary | ICD-10-CM | POA: Diagnosis not present

## 2018-05-20 DIAGNOSIS — Z0000 Encounter for general adult medical examination without abnormal findings: Secondary | ICD-10-CM | POA: Diagnosis not present

## 2018-05-20 DIAGNOSIS — Z114 Encounter for screening for human immunodeficiency virus [HIV]: Secondary | ICD-10-CM | POA: Diagnosis not present

## 2018-05-20 DIAGNOSIS — Z1329 Encounter for screening for other suspected endocrine disorder: Secondary | ICD-10-CM | POA: Diagnosis not present

## 2018-05-20 DIAGNOSIS — Z13220 Encounter for screening for lipoid disorders: Secondary | ICD-10-CM | POA: Diagnosis not present

## 2018-05-24 DIAGNOSIS — Z6822 Body mass index (BMI) 22.0-22.9, adult: Secondary | ICD-10-CM | POA: Diagnosis not present

## 2018-05-24 DIAGNOSIS — Z0000 Encounter for general adult medical examination without abnormal findings: Secondary | ICD-10-CM | POA: Diagnosis not present

## 2018-06-23 DIAGNOSIS — Z3201 Encounter for pregnancy test, result positive: Secondary | ICD-10-CM | POA: Diagnosis not present

## 2018-07-21 DIAGNOSIS — Z3201 Encounter for pregnancy test, result positive: Secondary | ICD-10-CM | POA: Diagnosis not present

## 2018-08-04 DIAGNOSIS — Z3A10 10 weeks gestation of pregnancy: Secondary | ICD-10-CM | POA: Diagnosis not present

## 2018-08-04 DIAGNOSIS — Z01419 Encounter for gynecological examination (general) (routine) without abnormal findings: Secondary | ICD-10-CM | POA: Diagnosis not present

## 2018-08-04 DIAGNOSIS — O3441 Maternal care for other abnormalities of cervix, first trimester: Secondary | ICD-10-CM | POA: Diagnosis not present

## 2018-08-04 DIAGNOSIS — Z3689 Encounter for other specified antenatal screening: Secondary | ICD-10-CM | POA: Diagnosis not present

## 2018-08-04 DIAGNOSIS — Z124 Encounter for screening for malignant neoplasm of cervix: Secondary | ICD-10-CM | POA: Diagnosis not present

## 2018-08-04 DIAGNOSIS — R1110 Vomiting, unspecified: Secondary | ICD-10-CM | POA: Diagnosis not present

## 2018-08-04 LAB — OB RESULTS CONSOLE RUBELLA ANTIBODY, IGM: Rubella: IMMUNE

## 2018-08-04 LAB — OB RESULTS CONSOLE HEPATITIS B SURFACE ANTIGEN: Hepatitis B Surface Ag: NEGATIVE

## 2018-08-04 LAB — OB RESULTS CONSOLE GC/CHLAMYDIA
Chlamydia: NEGATIVE
Gonorrhea: NEGATIVE

## 2018-08-04 LAB — OB RESULTS CONSOLE HIV ANTIBODY (ROUTINE TESTING): HIV: NONREACTIVE

## 2018-08-04 LAB — OB RESULTS CONSOLE RPR: RPR: NONREACTIVE

## 2018-08-16 DIAGNOSIS — L309 Dermatitis, unspecified: Secondary | ICD-10-CM | POA: Diagnosis not present

## 2018-08-16 DIAGNOSIS — R1110 Vomiting, unspecified: Secondary | ICD-10-CM | POA: Diagnosis not present

## 2018-08-16 DIAGNOSIS — R591 Generalized enlarged lymph nodes: Secondary | ICD-10-CM | POA: Diagnosis not present

## 2018-08-16 DIAGNOSIS — B354 Tinea corporis: Secondary | ICD-10-CM | POA: Diagnosis not present

## 2018-08-26 DIAGNOSIS — J069 Acute upper respiratory infection, unspecified: Secondary | ICD-10-CM | POA: Diagnosis not present

## 2018-09-06 DIAGNOSIS — Z3A14 14 weeks gestation of pregnancy: Secondary | ICD-10-CM | POA: Diagnosis not present

## 2018-09-06 DIAGNOSIS — O3442 Maternal care for other abnormalities of cervix, second trimester: Secondary | ICD-10-CM | POA: Diagnosis not present

## 2018-09-08 DIAGNOSIS — Z361 Encounter for antenatal screening for raised alphafetoprotein level: Secondary | ICD-10-CM | POA: Diagnosis not present

## 2018-09-24 DIAGNOSIS — R300 Dysuria: Secondary | ICD-10-CM | POA: Diagnosis not present

## 2018-09-24 DIAGNOSIS — O26893 Other specified pregnancy related conditions, third trimester: Secondary | ICD-10-CM | POA: Diagnosis not present

## 2018-09-24 DIAGNOSIS — Z3A17 17 weeks gestation of pregnancy: Secondary | ICD-10-CM | POA: Diagnosis not present

## 2018-09-24 DIAGNOSIS — O26892 Other specified pregnancy related conditions, second trimester: Secondary | ICD-10-CM | POA: Diagnosis not present

## 2018-09-24 DIAGNOSIS — O3442 Maternal care for other abnormalities of cervix, second trimester: Secondary | ICD-10-CM | POA: Diagnosis not present

## 2018-10-04 DIAGNOSIS — Z3A18 18 weeks gestation of pregnancy: Secondary | ICD-10-CM | POA: Diagnosis not present

## 2018-10-04 DIAGNOSIS — O26893 Other specified pregnancy related conditions, third trimester: Secondary | ICD-10-CM | POA: Diagnosis not present

## 2018-10-04 DIAGNOSIS — Z363 Encounter for antenatal screening for malformations: Secondary | ICD-10-CM | POA: Diagnosis not present

## 2018-10-06 NOTE — L&D Delivery Note (Signed)
Delivery Note At  a viable and healthy female was delivered over intact perineum via svd (Presentation:cephalic ; ROA ).  APGAR: 8/9, ; weight not yet done  .   Placenta status:delivered intact ,spontaneously .  Cord: 3vv,  with the following complications: none.  Anesthesia:  none Episiotomy:  none Lacerations:  very small laceration periurethral, hemostatic Suture Repair: n/a Est. Blood Loss (mL):   Mom to postpartum.  Baby to Couplet care / Skin to Skin.  Vick Frees 02/27/2019, 9:01 PM

## 2018-10-27 DIAGNOSIS — Z3A22 22 weeks gestation of pregnancy: Secondary | ICD-10-CM | POA: Diagnosis not present

## 2018-10-27 DIAGNOSIS — O3442 Maternal care for other abnormalities of cervix, second trimester: Secondary | ICD-10-CM | POA: Diagnosis not present

## 2018-10-29 DIAGNOSIS — O2200 Varicose veins of lower extremity in pregnancy, unspecified trimester: Secondary | ICD-10-CM | POA: Diagnosis not present

## 2018-10-29 DIAGNOSIS — Z6825 Body mass index (BMI) 25.0-25.9, adult: Secondary | ICD-10-CM | POA: Diagnosis not present

## 2018-11-09 DIAGNOSIS — M5441 Lumbago with sciatica, right side: Secondary | ICD-10-CM | POA: Diagnosis not present

## 2018-11-09 DIAGNOSIS — M9903 Segmental and somatic dysfunction of lumbar region: Secondary | ICD-10-CM | POA: Diagnosis not present

## 2018-11-09 DIAGNOSIS — M25552 Pain in left hip: Secondary | ICD-10-CM | POA: Diagnosis not present

## 2018-11-09 DIAGNOSIS — M9905 Segmental and somatic dysfunction of pelvic region: Secondary | ICD-10-CM | POA: Diagnosis not present

## 2018-11-12 DIAGNOSIS — M9903 Segmental and somatic dysfunction of lumbar region: Secondary | ICD-10-CM | POA: Diagnosis not present

## 2018-11-12 DIAGNOSIS — M9905 Segmental and somatic dysfunction of pelvic region: Secondary | ICD-10-CM | POA: Diagnosis not present

## 2018-11-12 DIAGNOSIS — M5441 Lumbago with sciatica, right side: Secondary | ICD-10-CM | POA: Diagnosis not present

## 2018-11-12 DIAGNOSIS — M25552 Pain in left hip: Secondary | ICD-10-CM | POA: Diagnosis not present

## 2018-11-22 DIAGNOSIS — M25552 Pain in left hip: Secondary | ICD-10-CM | POA: Diagnosis not present

## 2018-11-22 DIAGNOSIS — M9905 Segmental and somatic dysfunction of pelvic region: Secondary | ICD-10-CM | POA: Diagnosis not present

## 2018-11-22 DIAGNOSIS — M5441 Lumbago with sciatica, right side: Secondary | ICD-10-CM | POA: Diagnosis not present

## 2018-11-22 DIAGNOSIS — M9903 Segmental and somatic dysfunction of lumbar region: Secondary | ICD-10-CM | POA: Diagnosis not present

## 2018-11-23 DIAGNOSIS — O3442 Maternal care for other abnormalities of cervix, second trimester: Secondary | ICD-10-CM | POA: Diagnosis not present

## 2018-11-23 DIAGNOSIS — Z3A26 26 weeks gestation of pregnancy: Secondary | ICD-10-CM | POA: Diagnosis not present

## 2018-11-25 DIAGNOSIS — M5441 Lumbago with sciatica, right side: Secondary | ICD-10-CM | POA: Diagnosis not present

## 2018-11-25 DIAGNOSIS — M9905 Segmental and somatic dysfunction of pelvic region: Secondary | ICD-10-CM | POA: Diagnosis not present

## 2018-11-25 DIAGNOSIS — M9903 Segmental and somatic dysfunction of lumbar region: Secondary | ICD-10-CM | POA: Diagnosis not present

## 2018-11-25 DIAGNOSIS — M25552 Pain in left hip: Secondary | ICD-10-CM | POA: Diagnosis not present

## 2018-12-02 DIAGNOSIS — M25552 Pain in left hip: Secondary | ICD-10-CM | POA: Diagnosis not present

## 2018-12-02 DIAGNOSIS — M9903 Segmental and somatic dysfunction of lumbar region: Secondary | ICD-10-CM | POA: Diagnosis not present

## 2018-12-02 DIAGNOSIS — M9905 Segmental and somatic dysfunction of pelvic region: Secondary | ICD-10-CM | POA: Diagnosis not present

## 2018-12-02 DIAGNOSIS — M5441 Lumbago with sciatica, right side: Secondary | ICD-10-CM | POA: Diagnosis not present

## 2018-12-08 DIAGNOSIS — Z3689 Encounter for other specified antenatal screening: Secondary | ICD-10-CM | POA: Diagnosis not present

## 2018-12-08 DIAGNOSIS — Z3A28 28 weeks gestation of pregnancy: Secondary | ICD-10-CM | POA: Diagnosis not present

## 2018-12-08 DIAGNOSIS — O3442 Maternal care for other abnormalities of cervix, second trimester: Secondary | ICD-10-CM | POA: Diagnosis not present

## 2018-12-16 DIAGNOSIS — M9905 Segmental and somatic dysfunction of pelvic region: Secondary | ICD-10-CM | POA: Diagnosis not present

## 2018-12-16 DIAGNOSIS — M5441 Lumbago with sciatica, right side: Secondary | ICD-10-CM | POA: Diagnosis not present

## 2018-12-16 DIAGNOSIS — M9903 Segmental and somatic dysfunction of lumbar region: Secondary | ICD-10-CM | POA: Diagnosis not present

## 2018-12-16 DIAGNOSIS — M25552 Pain in left hip: Secondary | ICD-10-CM | POA: Diagnosis not present

## 2018-12-20 DIAGNOSIS — Z3483 Encounter for supervision of other normal pregnancy, third trimester: Secondary | ICD-10-CM | POA: Diagnosis not present

## 2018-12-20 DIAGNOSIS — Z23 Encounter for immunization: Secondary | ICD-10-CM | POA: Diagnosis not present

## 2019-01-13 DIAGNOSIS — Z3483 Encounter for supervision of other normal pregnancy, third trimester: Secondary | ICD-10-CM | POA: Diagnosis not present

## 2019-01-13 DIAGNOSIS — Z3686 Encounter for antenatal screening for cervical length: Secondary | ICD-10-CM | POA: Diagnosis not present

## 2019-01-25 DIAGNOSIS — Z3685 Encounter for antenatal screening for Streptococcus B: Secondary | ICD-10-CM | POA: Diagnosis not present

## 2019-01-25 DIAGNOSIS — Z3A35 35 weeks gestation of pregnancy: Secondary | ICD-10-CM | POA: Diagnosis not present

## 2019-01-25 DIAGNOSIS — O3443 Maternal care for other abnormalities of cervix, third trimester: Secondary | ICD-10-CM | POA: Diagnosis not present

## 2019-01-25 LAB — OB RESULTS CONSOLE GBS: GBS: NEGATIVE

## 2019-02-07 DIAGNOSIS — O3443 Maternal care for other abnormalities of cervix, third trimester: Secondary | ICD-10-CM | POA: Diagnosis not present

## 2019-02-07 DIAGNOSIS — Z3A36 36 weeks gestation of pregnancy: Secondary | ICD-10-CM | POA: Diagnosis not present

## 2019-02-12 ENCOUNTER — Observation Stay (HOSPITAL_COMMUNITY)
Admission: AD | Admit: 2019-02-12 | Discharge: 2019-02-13 | Disposition: A | Discharge status: Home / Self Care | Payer: BLUE CROSS/BLUE SHIELD

## 2019-02-12 ENCOUNTER — Encounter (HOSPITAL_COMMUNITY): Payer: Self-pay | Admitting: Emergency Medicine

## 2019-02-12 ENCOUNTER — Other Ambulatory Visit: Payer: Self-pay

## 2019-02-12 DIAGNOSIS — O4703 False labor before 37 completed weeks of gestation, third trimester: Secondary | ICD-10-CM | POA: Diagnosis not present

## 2019-02-12 DIAGNOSIS — Z3A37 37 weeks gestation of pregnancy: Secondary | ICD-10-CM | POA: Insufficient documentation

## 2019-02-12 LAB — TYPE AND SCREEN
ABO/RH(D): O POS
Antibody Screen: NEGATIVE

## 2019-02-12 LAB — CBC
HCT: 32.1 % — ABNORMAL LOW (ref 36.0–46.0)
Hemoglobin: 10.9 g/dL — ABNORMAL LOW (ref 12.0–15.0)
MCH: 30.1 pg (ref 26.0–34.0)
MCHC: 34 g/dL (ref 30.0–36.0)
MCV: 88.7 fL (ref 80.0–100.0)
Platelets: 224 10*3/uL (ref 150–400)
RBC: 3.62 MIL/uL — ABNORMAL LOW (ref 3.87–5.11)
RDW: 13.2 % (ref 11.5–15.5)
WBC: 12 10*3/uL — ABNORMAL HIGH (ref 4.0–10.5)
nRBC: 0 % (ref 0.0–0.2)

## 2019-02-12 MED ORDER — ACETAMINOPHEN 325 MG PO TABS: 650.0000 mg | ORAL_TABLET | ORAL | Status: DC | PRN

## 2019-02-12 MED ORDER — ONDANSETRON HCL 4 MG/2ML IJ SOLN: 4.0000 mg | Freq: Four times a day (QID) | INTRAMUSCULAR | Status: DC | PRN

## 2019-02-12 MED ORDER — LACTATED RINGERS IV SOLN: INTRAVENOUS | Status: DC

## 2019-02-12 MED ORDER — LIDOCAINE HCL (PF) 1 % IJ SOLN: 30.0000 mL | INTRAMUSCULAR | Status: DC | PRN

## 2019-02-12 MED ORDER — OXYTOCIN 40 UNITS IN NORMAL SALINE INFUSION - SIMPLE MED: 2.5000 [IU]/h | INTRAVENOUS | Status: DC

## 2019-02-12 MED ORDER — OXYCODONE-ACETAMINOPHEN 5-325 MG PO TABS: 1.0000 | ORAL_TABLET | ORAL | Status: DC | PRN

## 2019-02-12 MED ORDER — SOD CITRATE-CITRIC ACID 500-334 MG/5ML PO SOLN: 30.0000 mL | ORAL | Status: DC | PRN

## 2019-02-12 MED ORDER — OXYTOCIN 10 UNIT/ML IJ SOLN: 10.0000 [IU] | Freq: Once | INTRAMUSCULAR | Status: DC

## 2019-02-12 MED ORDER — OXYCODONE-ACETAMINOPHEN 5-325 MG PO TABS: 2.0000 | ORAL_TABLET | ORAL | Status: DC | PRN

## 2019-02-12 MED ORDER — OXYTOCIN BOLUS FROM INFUSION: 500.0000 mL | Freq: Once | INTRAVENOUS | Status: DC

## 2019-02-12 MED ORDER — LACTATED RINGERS IV SOLN: 500.0000 mL | INTRAVENOUS | Status: DC | PRN

## 2019-02-12 NOTE — H&P (Signed)
OB ADMISSION/ HISTORY & PHYSICAL:  Admission Date: 02/12/2019  6:09 PM  Admit Diagnosis: Contractions    Dana Anderson is a 33 y.o. female presenting for regular contraction since earlier today. Also reports LOF at 1630, appears clear. Notes feeling cramping 2/10, some mild nausea, + heartburn, no VB. Hx NSBV x 2, desires unmedicated labor.  Prenatal History: [id number]   EDC :  03/01/2019 Prenatal care at [facility] since 1st trimester CNM care   Prenatal course complicated by: Hx LEEP  Prenatal Labs: ABO, Rh:   O pos Antibody:  neg Rubella:   immune RPR:   NR HBsAg:   Neg HIV:   Neg GBS:   neg 1 hr Glucola : 128 Genetic Screening: quad screen wnl Ultrasound: normal anatomy, posterior placenta  Medical / Surgical History :  Past medical history:  Past Medical History:  Diagnosis Date  . Epigastric pain 11/23/2015  . Gallstones 11/23/2015  . Upper abdominal pain 11/23/2015  . Upper back pain on right side 11/23/2015     Past surgical history:  Past Surgical History:  Procedure Laterality Date  . CHOLECYSTECTOMY N/A 12/13/2015   Procedure: LAPAROSCOPIC CHOLECYSTECTOMY WITH INTRAOPERATIVE CHOLANGIOGRAM;  Surgeon: Avel Peace, MD;  Location: [facility] OR;  Service: General;  Laterality: N/A;  . TONSILLECTOMY       Family History:  Family History  Problem Relation Age of Onset  . Diabetes Mother   . Diabetes Maternal Grandmother   . Diabetes Maternal Grandfather      Social History:  reports that she has never smoked. She has never used smokeless tobacco. She reports current alcohol use. She reports that she does not use drugs.   Allergies: Patient has no known allergies.   Current Medications at time of admission:  PNV, Protonix   Review of Systems: ROS As noted above  Physical Exam: Vital signs and nursing notes reviewed.  ED Triage Vitals [02/12/19 1820]  Enc Vitals Group     BP 133/69     Pulse Rate 76     Resp 16     Temp 97.9  F (36.6 C)     Temp Source Oral     SpO2      Weight      Height      Head Circumference      Peak Flow      Pain Score 2     Pain Loc      Pain Edu?      Excl. in GC?      General: AAO x 3, NAD, coping well Heart: RRR Lungs:CTAB Abdomen: Gravid, NT, Leopold's cephalic, EFW 7 lbs Extremities: no edema Genitalia / VE:   4-5/80/-1,-2 BBOW, small show, vertex  FHR:  120 BPM, mod variability, + accels, - decels TOCO: Ctx q 2-3 min, palp mild  Labs:   Pending T&S, CBC, RPR    Assessment:  32 y.o. G3P2002 at 37 wks  1. Early stage of labor 2. FHR category 1 3. GBS neg 4. Desires unmedicated birth, well prepared 5. Breastfeeding 6. Placenta disposal per unit disposal  Plan:  1. Admit to BS 2. Routine L&D orders 3. Analgesia/anesthesia PRN  4. Expectant management 5. Anticipate NSVB  Dr Juliene Pina notified of admission / plan of care   Neta Mends CNM, MSN 02/12/2019, 6:33 PM

## 2019-02-13 DIAGNOSIS — Z3A37 37 weeks gestation of pregnancy: Secondary | ICD-10-CM | POA: Diagnosis not present

## 2019-02-13 LAB — RPR: RPR Ser Ql: NONREACTIVE

## 2019-02-13 NOTE — Progress Notes (Signed)
Spoke with D. Renae Fickle CNM about pt labor progress. Instructed to doppler FHR q1hr, if pt is sleeping do not wake to monitor FHR

## 2019-02-13 NOTE — Discharge Instructions (Signed)

## 2019-02-13 NOTE — Progress Notes (Signed)
S: Reports slept well overnight, ctx very rare, feels only occasionally and mild.    O: Vitals:   02/12/19 1820 02/12/19 2020 02/13/19 0121 02/13/19 0610  BP: 133/69 110/69 (!) 93/46 107/72  Pulse: 76 82 70 68  Resp: 16 16 16 18   Temp: 97.9 F (36.6 C) 98 F (36.7 C) 98.5 F (36.9 C) 98.6 F (37 C)  TempSrc: Oral Oral Oral Oral     FHT:  FHR: 125 bpm, variability: moderate,  accelerations:  Present,  decelerations:  Absent UC:   Irregular, mild SVE:   Dilation: 4.5 Effacement (%): 80 Station: -1, -2 Exam by:: Arlan Organ CNM   A / P: Protracted latent phase  Fetal Wellbeing:  Category I Pain Control:  Labor support without medications  Anticipated MOD:  no significant change in cervix. Discussed option to continue inpatient or discherge home to await active labor and patient desires later. Lives close to hospital, and even with advanced dilation is comfortable she would make it back to hospital in labor. Active labor precautions given.  Patient to F/U with scheduled office visit on Monday if not delivered.   Neta Mends, CNM, MSN 02/13/2019, 6:42 AM

## 2019-02-13 NOTE — Discharge Summary (Signed)
Physician Discharge Summary  Patient ID: Dana Anderson MRN: [id number] DOB/AGE: [birth year] 33 y.o.  Admit date: 02/12/2019 Discharge date: 02/13/2019  Admission Diagnoses:contractions Discharge Diagnoses: latent labor / false labor   Discharged Condition: stable  Hospital Course: Patient admitted to labor and delivery with regular pattern contractions and monitored overnight. Fetal heart tracing intermittent through the night category 1, contractions slowed down at midnight and patient slept for next several hours. Cervical change minimal upon recheck in AM. Patient recommended for discharge and she agrees.  Active labor precautions reinforced, patient feels comfortable going home to await active labor, lives 10 minutes away from hospital.   Consults: None  Significant Diagnostic Studies: labs:  CBC    Component Value Date/Time   WBC 12.0 (H) 02/12/2019 2253   RBC 3.62 (L) 02/12/2019 2253   HGB 10.9 (L) 02/12/2019 2253   HCT 32.1 (L) 02/12/2019 2253   PLT 224 02/12/2019 2253   MCV 88.7 02/12/2019 2253   MCH 30.1 02/12/2019 2253   MCHC 34.0 02/12/2019 2253   RDW 13.2 02/12/2019 2253   LYMPHSABS 1.0 05/01/2015 1030   MONOABS 1.0 05/01/2015 1030   EOSABS 0.0 05/01/2015 1030   BASOSABS 0.0 05/01/2015 1030     Treatments: none  Discharge Exam: Blood pressure (!) 93/46, pulse 70, temperature 98.5 F (36.9 C), temperature source Oral, resp. rate 16, currently breastfeeding. General appearance: alert, cooperative and no distress GI: normal findings: soft, non-tender and gravid Pelvic: 4-5/80/-1, BBOW Extremities: no edema, redness or tenderness in the calves or thighs  Disposition: Discharge disposition: 01-Home or Self Care       Discharge Instructions    Discharge patient   Complete by:  As directed    Discharge disposition:  01-Home or Self Care   Discharge patient date:  02/13/2019     Allergies as of 02/13/2019   No Known Allergies     Medication List     STOP taking these medications   HYDROcodone-acetaminophen 5-325 MG tablet Commonly known as:  NORCO/VICODIN   ibuprofen 200 MG tablet Commonly known as:  ADVIL   ibuprofen 600 MG tablet Commonly known as:  ADVIL     TAKE these medications   docusate sodium 100 MG capsule Commonly known as:  COLACE Take 100 mg by mouth daily as needed for mild constipation.   folic acid 1 MG tablet Commonly known as:  FOLVITE Take 1 mg by mouth daily.   hydrocortisone cream 1 % Apply 1 application topically daily as needed for itching.   Magnesium 250 MG Tabs Take 250 mg by mouth daily.   ondansetron 4 MG tablet Commonly known as:  ZOFRAN Take 1 tablet (4 mg total) by mouth every 4 (four) hours as needed for nausea.   polyethylene glycol 17 g packet Commonly known as:  MIRALAX / GLYCOLAX Take 17 g by mouth daily.   prenatal multivitamin Tabs tablet Take 1 tablet by mouth daily at 12 noon.      Follow-up Information    Daisi, Gayton, CNM. Go on 02/14/2019.   Specialty:  Obstetrics and Gynecology Contact information: [address] [place] [address] [phone]           Signed: Neta Mends 02/13/2019, 6:33 AM

## 2019-02-14 DIAGNOSIS — Z3A37 37 weeks gestation of pregnancy: Secondary | ICD-10-CM | POA: Diagnosis not present

## 2019-02-14 DIAGNOSIS — O3443 Maternal care for other abnormalities of cervix, third trimester: Secondary | ICD-10-CM | POA: Diagnosis not present

## 2019-02-14 LAB — ABO/RH: ABO/RH(D): O POS

## 2019-02-21 DIAGNOSIS — O3443 Maternal care for other abnormalities of cervix, third trimester: Secondary | ICD-10-CM | POA: Diagnosis not present

## 2019-02-21 DIAGNOSIS — Z3A38 38 weeks gestation of pregnancy: Secondary | ICD-10-CM | POA: Diagnosis not present

## 2019-02-27 ENCOUNTER — Encounter (HOSPITAL_COMMUNITY): Payer: Self-pay | Admitting: *Deleted

## 2019-02-27 ENCOUNTER — Inpatient Hospital Stay (HOSPITAL_COMMUNITY)
Admission: AD | Admit: 2019-02-27 | Discharge: 2019-03-01 | DRG: 807 | Disposition: A | Discharge status: Home / Self Care | Payer: BLUE CROSS/BLUE SHIELD | Attending: Obstetrics and Gynecology | Admitting: Obstetrics and Gynecology

## 2019-02-27 ENCOUNTER — Other Ambulatory Visit: Payer: Self-pay

## 2019-02-27 DIAGNOSIS — D649 Anemia, unspecified: Secondary | ICD-10-CM | POA: Diagnosis not present

## 2019-02-27 DIAGNOSIS — O9902 Anemia complicating childbirth: Secondary | ICD-10-CM | POA: Diagnosis not present

## 2019-02-27 DIAGNOSIS — Z1159 Encounter for screening for other viral diseases: Secondary | ICD-10-CM | POA: Diagnosis not present

## 2019-02-27 DIAGNOSIS — O26893 Other specified pregnancy related conditions, third trimester: Secondary | ICD-10-CM | POA: Diagnosis not present

## 2019-02-27 DIAGNOSIS — Z3A39 39 weeks gestation of pregnancy: Secondary | ICD-10-CM

## 2019-02-27 DIAGNOSIS — K219 Gastro-esophageal reflux disease without esophagitis: Secondary | ICD-10-CM | POA: Diagnosis present

## 2019-02-27 DIAGNOSIS — O9962 Diseases of the digestive system complicating childbirth: Secondary | ICD-10-CM | POA: Diagnosis not present

## 2019-02-27 LAB — URINALYSIS, ROUTINE W REFLEX MICROSCOPIC
Bilirubin Urine: NEGATIVE
Glucose, UA: NEGATIVE mg/dL
Ketones, ur: NEGATIVE mg/dL
Nitrite: NEGATIVE
Protein, ur: NEGATIVE mg/dL
Specific Gravity, Urine: 1.003 — ABNORMAL LOW (ref 1.005–1.030)
pH: 7 (ref 5.0–8.0)

## 2019-02-27 LAB — TYPE AND SCREEN
ABO/RH(D): O POS
Antibody Screen: NEGATIVE

## 2019-02-27 LAB — CBC
HCT: 36.1 % (ref 36.0–46.0)
Hemoglobin: 12.1 g/dL (ref 12.0–15.0)
MCH: 29.6 pg (ref 26.0–34.0)
MCHC: 33.5 g/dL (ref 30.0–36.0)
MCV: 88.3 fL (ref 80.0–100.0)
Platelets: 274 10*3/uL (ref 150–400)
RBC: 4.09 MIL/uL (ref 3.87–5.11)
RDW: 13.7 % (ref 11.5–15.5)
WBC: 13.6 10*3/uL — ABNORMAL HIGH (ref 4.0–10.5)
nRBC: 0 % (ref 0.0–0.2)

## 2019-02-27 LAB — SARS CORONAVIRUS 2 BY RT PCR (HOSPITAL ORDER, PERFORMED IN ~~LOC~~ HOSPITAL LAB): SARS Coronavirus 2: NEGATIVE

## 2019-02-27 MED ORDER — ONDANSETRON HCL 4 MG PO TABS: 4.0000 mg | ORAL_TABLET | ORAL | Status: DC | PRN

## 2019-02-27 MED ORDER — OXYCODONE-ACETAMINOPHEN 5-325 MG PO TABS: 1.0000 | ORAL_TABLET | ORAL | Status: DC | PRN

## 2019-02-27 MED ORDER — LACTATED RINGERS IV SOLN: INTRAVENOUS | Status: DC

## 2019-02-27 MED ORDER — WITCH HAZEL-GLYCERIN EX PADS
1.0000 "application " | MEDICATED_PAD | CUTANEOUS | Status: DC | PRN
  Administered 2019-02-28: 1 via TOPICAL

## 2019-02-27 MED ORDER — OXYTOCIN BOLUS FROM INFUSION
500.0000 mL | Freq: Once | INTRAVENOUS | Status: AC
  Administered 2019-02-27: 500 mL/h via INTRAVENOUS

## 2019-02-27 MED ORDER — ONDANSETRON HCL 4 MG/2ML IJ SOLN: 4.0000 mg | INTRAMUSCULAR | Status: DC | PRN

## 2019-02-27 MED ORDER — DOCUSATE SODIUM 100 MG PO CAPS
100.0000 mg | ORAL_CAPSULE | Freq: Two times a day (BID) | ORAL | Status: DC
  Administered 2019-02-27 – 2019-03-01 (×4): 100 mg via ORAL
  Filled 2019-02-27 (×4): qty 1

## 2019-02-27 MED ORDER — ACETAMINOPHEN 325 MG PO TABS: 650.0000 mg | ORAL_TABLET | ORAL | Status: DC | PRN

## 2019-02-27 MED ORDER — ONDANSETRON HCL 4 MG/2ML IJ SOLN: 4.0000 mg | Freq: Four times a day (QID) | INTRAMUSCULAR | Status: DC | PRN

## 2019-02-27 MED ORDER — LACTATED RINGERS IV SOLN: 500.0000 mL | INTRAVENOUS | Status: DC | PRN

## 2019-02-27 MED ORDER — PRENATAL MULTIVITAMIN CH
1.0000 | ORAL_TABLET | Freq: Every day | ORAL | Status: DC
  Administered 2019-02-28 – 2019-03-01 (×2): 1 via ORAL
  Filled 2019-02-27 (×2): qty 1

## 2019-02-27 MED ORDER — BENZOCAINE-MENTHOL 20-0.5 % EX AERO: 1.0000 "application " | INHALATION_SPRAY | CUTANEOUS | Status: DC | PRN

## 2019-02-27 MED ORDER — OXYCODONE-ACETAMINOPHEN 5-325 MG PO TABS: 2.0000 | ORAL_TABLET | ORAL | Status: DC | PRN

## 2019-02-27 MED ORDER — OXYTOCIN 40 UNITS IN NORMAL SALINE INFUSION - SIMPLE MED
2.5000 [IU]/h | INTRAVENOUS | Status: DC
  Filled 2019-02-27: qty 1000

## 2019-02-27 MED ORDER — DIBUCAINE (PERIANAL) 1 % EX OINT
1.0000 "application " | TOPICAL_OINTMENT | CUTANEOUS | Status: DC | PRN
  Administered 2019-02-28: 1 via RECTAL
  Filled 2019-02-27: qty 28

## 2019-02-27 MED ORDER — IBUPROFEN 600 MG PO TABS
600.0000 mg | ORAL_TABLET | Freq: Four times a day (QID) | ORAL | Status: DC
  Administered 2019-02-27 – 2019-03-01 (×6): 600 mg via ORAL
  Filled 2019-02-27 (×7): qty 1

## 2019-02-27 MED ORDER — COCONUT OIL OIL: 1.0000 "application " | TOPICAL_OIL | Status: DC | PRN

## 2019-02-27 MED ORDER — FLEET ENEMA 7-19 GM/118ML RE ENEM: 1.0000 | ENEMA | RECTAL | Status: DC | PRN

## 2019-02-27 MED ORDER — LIDOCAINE HCL (PF) 1 % IJ SOLN: 30.0000 mL | INTRAMUSCULAR | Status: DC | PRN

## 2019-02-27 MED ORDER — SIMETHICONE 80 MG PO CHEW: 80.0000 mg | CHEWABLE_TABLET | ORAL | Status: DC | PRN

## 2019-02-27 MED ORDER — DIPHENHYDRAMINE HCL 25 MG PO CAPS: 25.0000 mg | ORAL_CAPSULE | Freq: Four times a day (QID) | ORAL | Status: DC | PRN

## 2019-02-27 MED ORDER — SOD CITRATE-CITRIC ACID 500-334 MG/5ML PO SOLN: 30.0000 mL | ORAL | Status: DC | PRN

## 2019-02-27 NOTE — MAU Note (Signed)
Pt reports ctx for past 3 hours q 3-4 min.  Pt reports cervical check of 4-5 when she was here 2 weeks ago.  Pt denies LOF or vag bleeding.  Pt reports good fetal movement.

## 2019-02-27 NOTE — H&P (Signed)
Dana Anderson is a 33 y.o. G3P2002 at 3051w5d gestation presents for complaint of Contractions today about Lelon Huhq 5 min.  Denies vb, lof, does note +FM.  Antepartum course: only complicated with GERD - protonix; h/o LEEP, [facility]  See complete pre-natal records  History OB History    Gravida  3   Para  2   Term  2   Preterm      AB      Living  2     SAB      TAB      Ectopic      Multiple  0   Live Births  2          Past Medical History:  Diagnosis Date  . Epigastric pain 11/23/2015  . Gallstones 11/23/2015  . Upper abdominal pain 11/23/2015  . Upper back pain on right side 11/23/2015   Past Surgical History:  Procedure Laterality Date  . CHOLECYSTECTOMY N/A 12/13/2015   Procedure: LAPAROSCOPIC CHOLECYSTECTOMY WITH INTRAOPERATIVE CHOLANGIOGRAM;  Surgeon: Avel Peaceodd Rosenbower, MD;  [facility];  Service: General;  Laterality: N/A;  . CHOLECYSTECTOMY  2017  . TONSILLECTOMY     Family History: family history includes Diabetes in her maternal grandfather, maternal grandmother, and mother. Social History:  reports that she has never smoked. She has never used smokeless tobacco. She reports current alcohol use. She reports that she does not use drugs.  ROS: See above otherwise negative  Prenatal labs:  ABO, Rh: --/--/O POS (05/24 1839) Antibody: NEG (05/24 1839) Rubella: Immune (10/30 0000) RPR: Non Reactive (05/09 2253)  HBsAg: Negative (10/30 0000)  HIV:Non-reactive (10/30 0000)  GBS: Negative (04/21 0000)  1 hr Glucola: Normal Genetic screening: Normal Anatomy US: Normal  Physical Exam:   Dilation: 9 Effacement (%): 90 Station: 0 Exam by:: Adelina MingsJ. Davis, RN Blood pressure 138/82, pulse 94, temperature 98.2 F (36.8 C), temperature source Oral, resp. rate 18, height 5\' 6"  (1.676 m), weight 77.7 kg, SpO2 98 %, currently breastfeeding. A&O x 3 HEENT:grossly Normal Lungs: CTAB CV: RRR Abdominal: Soft, Non-tender,  Gravid and Estimated fetal weight: 7.5-8lbs lbs  Lower Extremities: Non-edematous, Non-tender  Pelvic Exam:      Dilatation: 9cm     Effacement: 80%     Station:   0     Presentation: Cephalic arom with clear fluid, small amount  Labs:  CBC:  Lab Results  Component Value Date   WBC 13.6 (H) 02/27/2019   RBC 4.09 02/27/2019   HGB 12.1 02/27/2019   HCT 36.1 02/27/2019   MCV 88.3 02/27/2019   MCH 29.6 02/27/2019   MCHC 33.5 02/27/2019   RDW 13.7 02/27/2019   PLT 274 02/27/2019   CMP:  Lab Results  Component Value Date   NA 142 12/11/2015   K 4.0 12/11/2015   CL 106 12/11/2015   CO2 24 12/11/2015   GLUCOSE 84 12/11/2015   BUN 14 12/11/2015   CREATININE 0.77 12/11/2015   CALCIUM 9.4 12/11/2015   PROT 6.5 12/11/2015   AST 31 12/11/2015   ALT 37 12/11/2015   ALBUMIN 4.0 12/11/2015   ALKPHOS 77 12/11/2015   BILITOT 0.4 12/11/2015   GFRNONAA >60 12/11/2015   GFRAA >60 12/11/2015   ANIONGAP 12 12/11/2015   Urine: Lab Results  Component Value Date   COLORURINE STRAW (A) 02/27/2019   APPEARANCEUR CLEAR 02/27/2019   LABSPEC 1.003 (L) 02/27/2019   PHURINE 7.0 02/27/2019   GLUCOSEU NEGATIVE 02/27/2019  HGBUR SMALL (A) 02/27/2019   BILIRUBINUR NEGATIVE 02/27/2019   KETONESUR NEGATIVE 02/27/2019   PROTEINUR NEGATIVE 02/27/2019   NITRITE NEGATIVE 02/27/2019   LEUKOCYTESUR MODERATE (A) 02/27/2019     Prenatal Transfer Tool  Maternal Diabetes: No Genetic Screening: Normal Maternal Ultrasounds/Referrals: Normal Fetal Ultrasounds or other Referrals:  None Maternal Substance Abuse:  No Significant Maternal Medications:  Meds include: Protonix Significant Maternal Lab Results: Lab values include: Group B Strep negative  Fht: 130s-140s, nml variability, +accels, occ variable; at times difficult to trace but no decels Toco: q 2 min   Assessment/Plan:  33 y.o. G3P2002 at [redacted]w[redacted]d gestation   1. Active labor - plan svd; first baby 8'14", second about 7 1/2  lbs 2. Fetal status reassuring 3. gbs neg 4. Rh pos 5.    Vick Frees 02/27/2019, 7:52 PM

## 2019-02-28 LAB — CBC
HCT: 32.9 % — ABNORMAL LOW (ref 36.0–46.0)
Hemoglobin: 11.1 g/dL — ABNORMAL LOW (ref 12.0–15.0)
MCH: 29.9 pg (ref 26.0–34.0)
MCHC: 33.7 g/dL (ref 30.0–36.0)
MCV: 88.7 fL (ref 80.0–100.0)
Platelets: 249 10*3/uL (ref 150–400)
RBC: 3.71 MIL/uL — ABNORMAL LOW (ref 3.87–5.11)
RDW: 13.7 % (ref 11.5–15.5)
WBC: 18 10*3/uL — ABNORMAL HIGH (ref 4.0–10.5)
nRBC: 0 % (ref 0.0–0.2)

## 2019-02-28 LAB — RPR: RPR Ser Ql: NONREACTIVE

## 2019-02-28 MED ORDER — HYDROCORT-PRAMOXINE (PERIANAL) 1-1 % EX FOAM
1.0000 | Freq: Three times a day (TID) | CUTANEOUS | Status: DC
  Administered 2019-02-28 (×2): 1 via RECTAL
  Filled 2019-02-28: qty 10

## 2019-02-28 NOTE — Lactation Note (Signed)
This note was copied from a baby's chart. Lactation Consultation Note  Patient Name: Dana Anderson Date: 02/28/2019 Reason for consult: Follow-up assessment  1759 - 24 - I visited Dana Anderson to check on her progress with breast feeding. She states that breast feeding is going well, and she denies pain with latch.  We briefly reviewed day 2 infant feeding patterns, feeding frequency and duration, and output expectations.  Mom states that this baby latches better than her previous two. She does not have any questions or concerns at this time. Mom has a personal pump at home.  We reviewed how to manage engorgement, and I reviewed the community breast feeding resources.  Mom indicated that she would prefer to call us as needed, and at this time, she does not require follow up.   Maternal Data Formula Feeding for Exclusion: No Has patient been taught Hand Expression?: Yes Does the patient have breastfeeding experience prior to this delivery?: Yes  Feeding Feeding Type: Breast Fed  Interventions Interventions: Breast feeding basics reviewed  Lactation Tools Discussed/Used     Consult Status Consult Status: Complete Date: 02/28/19 Follow-up type: Call as needed    Walker Shadow 02/28/2019, 6:23 PM

## 2019-02-28 NOTE — Lactation Note (Signed)
This note was copied from a baby's chart. Lactation Consultation Note Baby 9 hrs old. Experienced BF mom has 5 1/2 and 3 1/33 yr old that she BF both of the for 8 months each. Mom states she had no difficulties and this baby has been BF great. Baby has had low temp. Mom holding baby, placed baby inside mom's gown and covered w/blanket.  Encouraged mom to call for assistance or questions. Lactation brochure left at bedside.  Patient Name: Dana Anderson [id number] Date: 02/28/2019 Reason for consult: Initial assessment;Term   Maternal Data Has patient been taught Hand Expression?: Yes Does the patient have breastfeeding experience prior to this delivery?: Yes  Feeding Feeding Type: Breast Fed  LATCH Score Latch: Grasps breast easily, tongue down, lips flanged, rhythmical sucking.  Audible Swallowing: A few with stimulation  Type of Nipple: Everted at rest and after stimulation  Comfort (Breast/Nipple): Soft / non-tender  Hold (Positioning): No assistance needed to correctly position infant at breast.  LATCH Score: 9  Interventions Interventions: Breast feeding basics reviewed  Lactation Tools Discussed/Used WIC Program: No   Consult Status Consult Status: Follow-up Date: 03/01/19 Follow-up type: In-patient    Charyl Dancer 02/28/2019, 6:25 AM

## 2019-02-28 NOTE — Progress Notes (Signed)
PPD 1 SVD - intact  Subjective:   reports feeling well - hemorrhoidal pain tolerating po intake without nausea or vomiting vaginal bleeding reported as light up ad lib / ambulatory in room / voiding QS without urinary concerns  female newborn Breastfeeding well  Objective:   VS: BP 117/72 (BP Location: Right Arm)   Pulse 72   Temp 98 F (36.7 C) (Oral)   Resp 16   Ht 5\' 6"  (1.676 m)   Wt 77.7 kg   SpO2 99%   Breastfeeding Unknown   BMI 27.63 kg/m   LABS:  Recent Labs    02/27/19 1821 02/28/19 0458  WBC 13.6* 18.0*  HGB 12.1 11.1*  PLT 274 249   Blood type: O posiitve Rubella: Immune (10/30 0000)        Physical Exam: alert and oriented X3 without any distress or pai abdomen soft, non-tender, non-distended  uterine fundus firm, non-tender, Ueven perineum intact / no edema lochia light extremities: no edema, no calf pain or tenderness   Assessment / Plan:  PPD # 1 SVD - intact / no repair  doing well - stable status routine post partum orders anticipate DC tomorrow  Marlinda Mike CNM, MSN, [facility] 02/28/2019, 9:01 AM

## 2019-03-01 MED ORDER — IBUPROFEN 600 MG PO TABS: 600.0000 mg | ORAL_TABLET | Freq: Four times a day (QID) | ORAL | 0 refills | Status: DC

## 2019-03-01 NOTE — Lactation Note (Signed)
This note was copied from a baby's chart. Lactation Consultation Note  Patient Name: Dana Anderson [id number]'P Date: 03/01/2019 Reason for consult: Follow-up assessment;Other (Comment)(P3 )  Baby is 35 hours  As LC entered the room dad holding baby and mom eating a snack.  Per mom baby last fed at 7:10 am for 16 mins. Mom reports swallows.  Mom denies soreness, engorgement prevention and tx reviewed.  LC discussed 8 % weight loss and mom mentioned the baby cluster fed last  Night. LC made suggestions to enhance milk let down, warm wash cloth prior  To massage , hand express, and compressions with latch and intermittent,  Alternating between at least 2 positions.  Storage of breast milk in the mother and baby care booklet.  Per mom has DEBP at home.  Mom aware of nutritive sucking vs non - nutritive and hanging out latched.  Mom mentioned the baby has been spitty. LC recommended burping the baby before Latching and inbetween, and after to keep the gas going downward.  Mom aware of the [place] resources for breast feeding.    Maternal Data    Feeding Feeding Type: Breast Fed  LATCH Score                   Interventions Interventions: Breast feeding basics reviewed  Lactation Tools Discussed/Used Pump Review: Milk Storage   Consult Status Consult Status: Complete Date: 03/01/19    Kathrin Greathouse 03/01/2019, 8:33 AM

## 2019-03-01 NOTE — Progress Notes (Signed)
No c/o; normal lochia Voiding w/o difficulty, tol po; breastfeeding  Temp:  [97.6 F (36.4 C)-98.2 F (36.8 C)] 98 F (36.7 C) (05/26 0500) Pulse Rate:  [60-77] 60 (05/26 0500) Resp:  [16-18] 16 (05/26 0500) BP: (102-107)/(65-70) 102/65 (05/26 0500)  A&ox3 nml respirations Abd: soft, nt; uterus firm  Le: no edema, nt bilat  CBC Latest Ref Rng & Units 02/28/2019 02/27/2019 02/12/2019  WBC 4.0 - 10.5 K/uL 18.0(H) 13.6(H) 12.0(H)  Hemoglobin 12.0 - 15.0 g/dL 11.1(L) 12.1 10.9(L)  Hematocrit 36.0 - 46.0 % 32.9(L) 36.1 32.1(L)  Platelets 150 - 400 K/uL 249 274 224   A/p: ppd 2 s/p svd 1. Doing well, d/c home today with f/u in 6 wks 2. Mild chronic anemia - plan iron rich foods pp; asymptomatic 3. Rubella immune 4. Rh pos

## 2019-03-04 NOTE — Discharge Summary (Signed)
Obstetric Discharge Summary Reason for Admission: onset of labor Prenatal Procedures: none Intrapartum Procedures: spontaneous vaginal delivery Postpartum Procedures: none Complications-Operative and Postpartum: none Hemoglobin  Date Value Ref Range Status  02/28/2019 11.1 (L) 12.0 - 15.0 g/dL Final   HCT  Date Value Ref Range Status  02/28/2019 32.9 (L) 36.0 - 46.0 % Final    Physical Exam:  General: alert and cooperative Lochia: appropriate Uterine Fundus: firm Incision: n/a DVT Evaluation: No evidence of DVT seen on physical exam.  Discharge Diagnoses: Term Pregnancy-delivered  Discharge Information: Date: 03/04/2019 Activity: pelvic rest Diet: routine Medications: Ibuprofen Condition: stable Instructions: refer to practice specific booklet Discharge to: home   Newborn Data: Live born female  Birth Weight: 7 lb 9.7 oz (3450 g) APGAR: 8, 9  Newborn Delivery   Birth date/time:  02/27/2019 20:47:00 Delivery type:  Vaginal, Spontaneous     Home with mother.  Dana Anderson 03/04/2019, 9:32 AM

## 2019-04-05 DIAGNOSIS — M9903 Segmental and somatic dysfunction of lumbar region: Secondary | ICD-10-CM | POA: Diagnosis not present

## 2019-04-05 DIAGNOSIS — M9905 Segmental and somatic dysfunction of pelvic region: Secondary | ICD-10-CM | POA: Diagnosis not present

## 2019-04-05 DIAGNOSIS — M25552 Pain in left hip: Secondary | ICD-10-CM | POA: Diagnosis not present

## 2019-04-05 DIAGNOSIS — M5441 Lumbago with sciatica, right side: Secondary | ICD-10-CM | POA: Diagnosis not present

## 2019-04-12 DIAGNOSIS — M9903 Segmental and somatic dysfunction of lumbar region: Secondary | ICD-10-CM | POA: Diagnosis not present

## 2019-04-12 DIAGNOSIS — M5441 Lumbago with sciatica, right side: Secondary | ICD-10-CM | POA: Diagnosis not present

## 2019-04-12 DIAGNOSIS — M9905 Segmental and somatic dysfunction of pelvic region: Secondary | ICD-10-CM | POA: Diagnosis not present

## 2019-04-12 DIAGNOSIS — M25552 Pain in left hip: Secondary | ICD-10-CM | POA: Diagnosis not present

## 2019-04-26 DIAGNOSIS — M5441 Lumbago with sciatica, right side: Secondary | ICD-10-CM | POA: Diagnosis not present

## 2019-04-26 DIAGNOSIS — M9905 Segmental and somatic dysfunction of pelvic region: Secondary | ICD-10-CM | POA: Diagnosis not present

## 2019-04-26 DIAGNOSIS — M25552 Pain in left hip: Secondary | ICD-10-CM | POA: Diagnosis not present

## 2019-04-26 DIAGNOSIS — M9903 Segmental and somatic dysfunction of lumbar region: Secondary | ICD-10-CM | POA: Diagnosis not present

## 2019-05-17 DIAGNOSIS — M9903 Segmental and somatic dysfunction of lumbar region: Secondary | ICD-10-CM | POA: Diagnosis not present

## 2019-05-17 DIAGNOSIS — M25552 Pain in left hip: Secondary | ICD-10-CM | POA: Diagnosis not present

## 2019-05-17 DIAGNOSIS — M9905 Segmental and somatic dysfunction of pelvic region: Secondary | ICD-10-CM | POA: Diagnosis not present

## 2019-05-17 DIAGNOSIS — M5441 Lumbago with sciatica, right side: Secondary | ICD-10-CM | POA: Diagnosis not present

## 2019-05-25 DIAGNOSIS — Z13220 Encounter for screening for lipoid disorders: Secondary | ICD-10-CM | POA: Diagnosis not present

## 2019-05-25 DIAGNOSIS — Z713 Dietary counseling and surveillance: Secondary | ICD-10-CM | POA: Diagnosis not present

## 2019-06-14 DIAGNOSIS — D1801 Hemangioma of skin and subcutaneous tissue: Secondary | ICD-10-CM | POA: Diagnosis not present

## 2019-06-14 DIAGNOSIS — M9903 Segmental and somatic dysfunction of lumbar region: Secondary | ICD-10-CM | POA: Diagnosis not present

## 2019-06-14 DIAGNOSIS — D225 Melanocytic nevi of trunk: Secondary | ICD-10-CM | POA: Diagnosis not present

## 2019-06-14 DIAGNOSIS — M25552 Pain in left hip: Secondary | ICD-10-CM | POA: Diagnosis not present

## 2019-06-14 DIAGNOSIS — M5441 Lumbago with sciatica, right side: Secondary | ICD-10-CM | POA: Diagnosis not present

## 2019-06-14 DIAGNOSIS — M9905 Segmental and somatic dysfunction of pelvic region: Secondary | ICD-10-CM | POA: Diagnosis not present

## 2019-07-13 ENCOUNTER — Other Ambulatory Visit: Payer: Self-pay

## 2019-07-13 DIAGNOSIS — Z20822 Contact with and (suspected) exposure to covid-19: Secondary | ICD-10-CM

## 2019-07-15 LAB — SPECIMEN STATUS REPORT

## 2019-07-15 LAB — NOVEL CORONAVIRUS, NAA: SARS-CoV-2, NAA: NOT DETECTED

## 2019-08-04 DIAGNOSIS — Z6822 Body mass index (BMI) 22.0-22.9, adult: Secondary | ICD-10-CM | POA: Diagnosis not present

## 2019-08-04 DIAGNOSIS — Z1151 Encounter for screening for human papillomavirus (HPV): Secondary | ICD-10-CM | POA: Diagnosis not present

## 2019-08-04 DIAGNOSIS — Z01419 Encounter for gynecological examination (general) (routine) without abnormal findings: Secondary | ICD-10-CM | POA: Diagnosis not present

## 2019-08-04 DIAGNOSIS — Z124 Encounter for screening for malignant neoplasm of cervix: Secondary | ICD-10-CM | POA: Diagnosis not present

## 2019-08-04 DIAGNOSIS — L659 Nonscarring hair loss, unspecified: Secondary | ICD-10-CM | POA: Diagnosis not present

## 2019-08-15 DIAGNOSIS — Z7189 Other specified counseling: Secondary | ICD-10-CM | POA: Diagnosis not present

## 2019-08-15 DIAGNOSIS — R946 Abnormal results of thyroid function studies: Secondary | ICD-10-CM | POA: Diagnosis not present

## 2019-08-15 DIAGNOSIS — L659 Nonscarring hair loss, unspecified: Secondary | ICD-10-CM | POA: Diagnosis not present

## 2019-08-16 ENCOUNTER — Other Ambulatory Visit: Payer: Self-pay

## 2019-08-16 DIAGNOSIS — Z20822 Contact with and (suspected) exposure to covid-19: Secondary | ICD-10-CM

## 2019-08-18 LAB — NOVEL CORONAVIRUS, NAA: SARS-CoV-2, NAA: NOT DETECTED

## 2019-09-20 DIAGNOSIS — E559 Vitamin D deficiency, unspecified: Secondary | ICD-10-CM | POA: Diagnosis not present

## 2019-09-20 DIAGNOSIS — R946 Abnormal results of thyroid function studies: Secondary | ICD-10-CM | POA: Diagnosis not present

## 2019-09-20 DIAGNOSIS — L659 Nonscarring hair loss, unspecified: Secondary | ICD-10-CM | POA: Diagnosis not present

## 2019-09-26 DIAGNOSIS — Z7189 Other specified counseling: Secondary | ICD-10-CM | POA: Diagnosis not present

## 2019-09-26 DIAGNOSIS — L659 Nonscarring hair loss, unspecified: Secondary | ICD-10-CM | POA: Diagnosis not present

## 2019-09-26 DIAGNOSIS — R946 Abnormal results of thyroid function studies: Secondary | ICD-10-CM | POA: Diagnosis not present

## 2019-09-26 DIAGNOSIS — E559 Vitamin D deficiency, unspecified: Secondary | ICD-10-CM | POA: Diagnosis not present

## 2019-10-03 DIAGNOSIS — M549 Dorsalgia, unspecified: Secondary | ICD-10-CM | POA: Diagnosis not present

## 2020-02-27 ENCOUNTER — Other Ambulatory Visit: Payer: Self-pay

## 2020-02-28 ENCOUNTER — Ambulatory Visit: Payer: 59 | Admitting: Adult Health

## 2020-02-28 ENCOUNTER — Encounter: Payer: Self-pay | Admitting: Adult Health

## 2020-02-28 VITALS — BP 100/72 | Temp 98.3°F | Wt 139.0 lb

## 2020-02-28 DIAGNOSIS — R311 Benign essential microscopic hematuria: Secondary | ICD-10-CM | POA: Diagnosis not present

## 2020-02-28 DIAGNOSIS — Z0000 Encounter for general adult medical examination without abnormal findings: Secondary | ICD-10-CM

## 2020-02-28 NOTE — Patient Instructions (Signed)
It was great meeting you today   Please follow up in July for your physical exam. If you need anything in the meantime, please do not hesitate to reach out.

## 2020-02-28 NOTE — Progress Notes (Signed)
Patient presents to clinic today to establish care. She is a pleasant 34 year old female who  has a past medical history of Epigastric pain (11/23/2015), Gallstones (11/23/2015), Upper abdominal pain (11/23/2015), and Upper back pain on right side (11/23/2015).  She is a former patient of Dr. Duanne Guess   Acute Concerns: Establish Care   Hematuria -she reports hematuria but only after she runs.  This is happened 2 times over the last year.  Hematuria is only present during the first urination after her exercise and then clears.  He denies discomfort or other symptoms with the hematuria and denies blood clots.  Chronic Issues: Vitamin D Deficiency - is taking prenatal vitamins with Vitamin D. She does not remember what her vitamin D level was the last time it was drawn.   Health Maintenance: Dental -- Routine  Vision -- Does not do routine care Immunizations -- UTD  Colonoscopy -- Never had  PAP -- 07/2019 - by [facility]  Diet: exercies on a routine basis  Exericise: eats healthy    Past Medical History:  Diagnosis Date  . Epigastric pain 11/23/2015  . Gallstones 11/23/2015  . Upper abdominal pain 11/23/2015  . Upper back pain on right side 11/23/2015    Past Surgical History:  Procedure Laterality Date  . CHOLECYSTECTOMY N/A 12/13/2015   Procedure: LAPAROSCOPIC CHOLECYSTECTOMY WITH INTRAOPERATIVE CHOLANGIOGRAM;  Surgeon: Avel Peace, MD;  Location: [facility];  Service: General;  Laterality: N/A;  . CHOLECYSTECTOMY  2017  . TONSILLECTOMY      Current Outpatient Medications on File Prior to Visit  Medication Sig Dispense Refill  . docusate sodium (COLACE) 100 MG capsule Take 100 mg by mouth daily as needed for mild constipation.    . hydrocortisone cream 1 % Apply 1 application topically daily as needed for itching.    Marland Kitchen ibuprofen (ADVIL) 600 MG tablet Take 1 tablet (600 mg total) by mouth every 6 (six) hours. 30 tablet 0  . Magnesium 250 MG TABS Take 250 mg by mouth daily.    .  polyethylene glycol (MIRALAX / GLYCOLAX) packet Take 17 g by mouth daily.    . Prenatal Vit-Fe Fumarate-FA (PRENATAL MULTIVITAMIN) TABS tablet Take 1 tablet by mouth daily at 12 noon.     No current facility-administered medications on file prior to visit.    No Known Allergies  Family History  Problem Relation Age of Onset  . Diabetes Mother   . Diabetes Maternal Grandmother   . Diabetes Maternal Grandfather     Social History   Socioeconomic History  . Marital status: Married    Spouse name: Not on file  . Number of children: Not on file  . Years of education: Not on file  . Highest education level: Not on file  Occupational History  . Not on file  Tobacco Use  . Smoking status: Never Smoker  . Smokeless tobacco: Never Used  Substance and Sexual Activity  . Alcohol use: Yes    Comment: socially only when not pregnant   . Drug use: No  . Sexual activity: Yes    Birth control/protection: None  Other Topics Concern  . Not on file  Social History Narrative  . Not on file   Social Determinants of Health   Financial Resource Strain:   . Difficulty of Paying Living Expenses:   Food Insecurity:   . Worried About Programme researcher, broadcasting/film/video in the Last Year:   . [employer] of Food in the Last  Year:   Transportation Needs:   . Film/video editor (Medical):   Marland Kitchen Lack of Transportation (Non-Medical):   Physical Activity:   . Days of Exercise per Week:   . Minutes of Exercise per Session:   Stress:   . Feeling of Stress :   Social Connections:   . Frequency of Communication with Friends and Family:   . Frequency of Social Gatherings with Friends and Family:   . Attends Religious Services:   . Active Member of Clubs or Organizations:   . Attends Archivist Meetings:   Marland Kitchen Marital Status:   Intimate Partner Violence:   . Fear of Current or Ex-Partner:   . Emotionally Abused:   Marland Kitchen Physically Abused:   . Sexually Abused:     Review of Systems  Constitutional:  Negative.   HENT: Negative.   Eyes: Negative.   Respiratory: Negative.   Cardiovascular: Negative.   Gastrointestinal: Negative.   Genitourinary: Positive for hematuria.  Musculoskeletal: Negative.   Skin: Negative.   Neurological: Negative.   Endo/Heme/Allergies: Negative.   Psychiatric/Behavioral: Negative.   All other systems reviewed and are negative.   There were no vitals taken for this visit.  Physical Exam Vitals and nursing note reviewed.  Constitutional:      Appearance: Normal appearance.  Cardiovascular:     Rate and Rhythm: Normal rate and regular rhythm.     Pulses: Normal pulses.     Heart sounds: Normal heart sounds.  Pulmonary:     Effort: Pulmonary effort is normal.     Breath sounds: Normal breath sounds.  Musculoskeletal:        General: Normal range of motion.     Cervical back: Normal range of motion and neck supple. No tenderness.  Skin:    General: Skin is warm and dry.     Capillary Refill: Capillary refill takes less than 2 seconds.  Neurological:     General: No focal deficit present.     Mental Status: She is alert and oriented to person, place, and time.  Psychiatric:        Mood and Affect: Mood normal.        Behavior: Behavior normal.        Thought Content: Thought content normal.        Judgment: Judgment normal.     No results found for this or any previous visit (from the past 2160 hour(s)).  Assessment/Plan: 1. Encounter for medical examination to establish care - Benign exam.  - Follow up in July for CPE  2. Benign essential microscopic hematuria -Exercise-induced which resolves quickly.  Continue to monitor no concern for other etiology  Dorothyann Peng, NP

## 2020-04-18 ENCOUNTER — Ambulatory Visit (INDEPENDENT_AMBULATORY_CARE_PROVIDER_SITE_OTHER): Payer: No Typology Code available for payment source | Admitting: Adult Health

## 2020-04-18 ENCOUNTER — Other Ambulatory Visit: Payer: Self-pay

## 2020-04-18 ENCOUNTER — Other Ambulatory Visit (INDEPENDENT_AMBULATORY_CARE_PROVIDER_SITE_OTHER): Payer: No Typology Code available for payment source

## 2020-04-18 ENCOUNTER — Encounter: Payer: Self-pay | Admitting: Adult Health

## 2020-04-18 VITALS — BP 90/68 | Temp 97.8°F | Ht 67.5 in | Wt 135.0 lb

## 2020-04-18 DIAGNOSIS — Z0000 Encounter for general adult medical examination without abnormal findings: Secondary | ICD-10-CM

## 2020-04-18 DIAGNOSIS — E559 Vitamin D deficiency, unspecified: Secondary | ICD-10-CM | POA: Diagnosis not present

## 2020-04-18 LAB — COMPREHENSIVE METABOLIC PANEL
ALT: 11 U/L (ref 0–35)
AST: 16 U/L (ref 0–37)
Albumin: 4.6 g/dL (ref 3.5–5.2)
Alkaline Phosphatase: 58 U/L (ref 39–117)
BUN: 15 mg/dL (ref 6–23)
CO2: 24 mEq/L (ref 19–32)
Calcium: 9.1 mg/dL (ref 8.4–10.5)
Chloride: 102 mEq/L (ref 96–112)
Creatinine, Ser: 0.75 mg/dL (ref 0.40–1.20)
GFR: 88.56 mL/min (ref >60.00)
Glucose, Bld: 73 mg/dL (ref 70–99)
Potassium: 4.2 mEq/L (ref 3.5–5.1)
Sodium: 136 mEq/L (ref 135–145)
Total Bilirubin: 0.7 mg/dL (ref 0.2–1.2)
Total Protein: 7 g/dL (ref 6.0–8.3)

## 2020-04-18 LAB — CBC WITH DIFFERENTIAL/PLATELET
Basophils Absolute: 0 10*3/uL (ref 0.0–0.1)
Basophils Relative: 0.7 % (ref 0.0–3.0)
Eosinophils Absolute: 0.3 10*3/uL (ref 0.0–0.7)
Eosinophils Relative: 4.7 % (ref 0.0–5.0)
HCT: 40.3 % (ref 36.0–46.0)
Hemoglobin: 13.6 g/dL (ref 12.0–15.0)
Lymphocytes Relative: 29.1 % (ref 12.0–46.0)
Lymphs Abs: 1.8 10*3/uL (ref 0.7–4.0)
MCHC: 33.8 g/dL (ref 30.0–36.0)
MCV: 87.9 fl (ref 78.0–100.0)
Monocytes Absolute: 0.6 10*3/uL (ref 0.1–1.0)
Monocytes Relative: 9.3 % (ref 3.0–12.0)
Neutro Abs: 3.5 10*3/uL (ref 1.4–7.7)
Neutrophils Relative %: 56.2 % (ref 43.0–77.0)
Platelets: 254 10*3/uL (ref 150.0–400.0)
RBC: 4.58 Mil/uL (ref 3.87–5.11)
RDW: 12.8 % (ref 11.5–15.5)
WBC: 6.2 10*3/uL (ref 4.0–10.5)

## 2020-04-18 LAB — LIPID PANEL
Cholesterol: 180 mg/dL (ref 0–200)
HDL: 61 mg/dL (ref >39.00)
LDL Cholesterol: 102 mg/dL — ABNORMAL HIGH (ref 0–99)
NonHDL: 119.04
Total CHOL/HDL Ratio: 3
Triglycerides: 86 mg/dL (ref 0.0–149.0)
VLDL: 17.2 mg/dL (ref 0.0–40.0)

## 2020-04-18 LAB — VITAMIN D 25 HYDROXY (VIT D DEFICIENCY, FRACTURES): VITD: 30.53 ng/mL (ref 30.00–100.00)

## 2020-04-18 LAB — TSH: TSH: 2.25 u[IU]/mL (ref 0.35–4.50)

## 2020-04-18 NOTE — Progress Notes (Signed)
Subjective:    Patient ID: Dana Anderson, female    DOB: [birth year], 34 y.o.   MRN: [id number]  HPI Patient presents for yearly preventative medicine examination.She is a pleasant 34 year old female who  has a past medical history of Gallstones (11/23/2015).  Vitamin D Deficiency - takes multi vitamin   All immunizations and health maintenance protocols were reviewed with the patient and needed orders were placed.  Appropriate screening laboratory values were ordered for the patient including screening of hyperlipidemia, renal function and hepatic function.  Medication reconciliation,  past medical history, social history, problem list and allergies were reviewed in detail with the patient  Goals were established with regard to weight loss, exercise, and  diet in compliance with medications. She is an avid runner and eats healthy   She is up-to-date on GYN care as well as dental and vision screens.  Review of Systems  Constitutional: Negative.   HENT: Negative.   Eyes: Negative.   Respiratory: Negative.   Cardiovascular: Negative.   Gastrointestinal: Negative.   Endocrine: Negative.   Genitourinary: Negative.   Musculoskeletal: Negative.   Skin: Negative.   Allergic/Immunologic: Negative.   Neurological: Negative.   Hematological: Negative.   Psychiatric/Behavioral: Negative.    Past Medical History:  Diagnosis Date  . Gallstones 11/23/2015    Social History   Socioeconomic History  . Marital status: Married    Spouse name: Not on file  . Number of children: Not on file  . Years of education: Not on file  . Highest education level: Not on file  Occupational History  . Not on file  Tobacco Use  . Smoking status: Never Smoker  . Smokeless tobacco: Never Used  Substance and Sexual Activity  . Alcohol use: Yes    Comment: socially only when not pregnant   . Drug use: No  . Sexual activity: Yes    Birth control/protection: None  Other Topics Concern  .  Not on file  Social History Narrative   Works at [employer]    Three children    Social Determinants of Health   Financial Resource Strain:   . Difficulty of Paying Living Expenses:   Food Insecurity:   . Worried About Programme researcher, broadcasting/film/video in the Last Year:   . Barista in the Last Year:   Transportation Needs:   . Freight forwarder (Medical):   Marland Kitchen Lack of Transportation (Non-Medical):   Physical Activity:   . Days of Exercise per Week:   . Minutes of Exercise per Session:   Stress:   . Feeling of Stress :   Social Connections:   . Frequency of Communication with Friends and Family:   . Frequency of Social Gatherings with Friends and Family:   . Attends Religious Services:   . Active Member of Clubs or Organizations:   . Attends Banker Meetings:   Marland Kitchen Marital Status:   Intimate Partner Violence:   . Fear of Current or Ex-Partner:   . Emotionally Abused:   Marland Kitchen Physically Abused:   . Sexually Abused:     Past Surgical History:  Procedure Laterality Date  . CHOLECYSTECTOMY N/A 12/13/2015   Procedure: LAPAROSCOPIC CHOLECYSTECTOMY WITH INTRAOPERATIVE CHOLANGIOGRAM;  Surgeon: Avel Peace, MD;  Location: [facility] OR;  Service: General;  Laterality: N/A;  . TONSILLECTOMY      Family History  Problem Relation Age of Onset  . Diabetes Mother   . Diabetes Maternal Grandmother   .  Diabetes Maternal Grandfather     No Known Allergies  Current Outpatient Medications on File Prior to Visit  Medication Sig Dispense Refill  . Multiple Vitamin (MULTIVITAMIN) tablet Take 1 tablet by mouth daily.     No current facility-administered medications on file prior to visit.    BP 90/68   Temp 97.8 F (36.6 C)   Ht 5' 7.5" (1.715 m)   Wt 135 lb (61.2 kg)   BMI 20.83 kg/m       Objective:   Physical Exam Vitals and nursing note reviewed.  Constitutional:      General: She is not in acute distress.    Appearance: Normal appearance. She is  well-developed. She is not ill-appearing.  HENT:     Head: Normocephalic and atraumatic.     Right Ear: Tympanic membrane, ear canal and external ear normal. There is no impacted cerumen.     Left Ear: Tympanic membrane, ear canal and external ear normal. There is no impacted cerumen.     Nose: Nose normal. No congestion or rhinorrhea.     Mouth/Throat:     Mouth: Mucous membranes are moist.     Pharynx: Oropharynx is clear. No oropharyngeal exudate or posterior oropharyngeal erythema.  Eyes:     General:        Right eye: No discharge.        Left eye: No discharge.     Extraocular Movements: Extraocular movements intact.     Conjunctiva/sclera: Conjunctivae normal.     Pupils: Pupils are equal, round, and reactive to light.  Neck:     Thyroid: No thyromegaly.     Vascular: No carotid bruit.     Trachea: No tracheal deviation.  Cardiovascular:     Rate and Rhythm: Normal rate and regular rhythm.     Pulses: Normal pulses.     Heart sounds: Normal heart sounds. No murmur heard.  No friction rub. No gallop.   Pulmonary:     Effort: Pulmonary effort is normal. No respiratory distress.     Breath sounds: Normal breath sounds. No stridor. No wheezing, rhonchi or rales.  Chest:     Chest wall: No tenderness.  Abdominal:     General: Abdomen is flat. Bowel sounds are normal. There is no distension.     Palpations: Abdomen is soft. There is no mass.     Tenderness: There is no abdominal tenderness. There is no right CVA tenderness, left CVA tenderness, guarding or rebound.     Hernia: No hernia is present.  Musculoskeletal:        General: No swelling, tenderness, deformity or signs of injury. Normal range of motion.     Cervical back: Normal range of motion and neck supple.     Right lower leg: No edema.     Left lower leg: No edema.  Lymphadenopathy:     Cervical: No cervical adenopathy.  Skin:    General: Skin is warm and dry.     Coloration: Skin is not jaundiced or pale.      Findings: No bruising, erythema, lesion or rash.  Neurological:     General: No focal deficit present.     Mental Status: She is alert and oriented to person, place, and time.     Cranial Nerves: No cranial nerve deficit.     Sensory: No sensory deficit.     Motor: No weakness.     Coordination: Coordination normal.     Gait: Gait normal.  Deep Tendon Reflexes: Reflexes normal.  Psychiatric:        Mood and Affect: Mood normal.        Behavior: Behavior normal.        Thought Content: Thought content normal.        Judgment: Judgment normal.       Assessment & Plan:  1. Routine general medical examination at a health care facility - Benign exam  - Follow up in one year or sooner if needed - CBC with Differential/Platelet; Future - Comprehensive metabolic panel; Future - Lipid panel; Future - TSH; Future  2. Vitamin D deficiency  - Vitamin D, 25-hydroxy; Future  Shirline Frees, NP

## 2020-04-18 NOTE — Addendum Note (Signed)
Addended by: Miguel Aschoff on: 04/18/2020 07:34 AM   Modules accepted: Orders

## 2020-04-18 NOTE — Patient Instructions (Signed)
It was great seeing you today   Continue to exercise and eat healthy   We will follow up with you once we get the labs back   You can attach your health form to [place] or send it to [email or web address]

## 2020-04-20 ENCOUNTER — Encounter: Payer: Self-pay | Admitting: Adult Health

## 2020-08-23 ENCOUNTER — Other Ambulatory Visit: Payer: Self-pay

## 2020-08-23 ENCOUNTER — Encounter: Payer: Self-pay | Admitting: Family Medicine

## 2020-08-23 ENCOUNTER — Ambulatory Visit: Payer: No Typology Code available for payment source | Admitting: Family Medicine

## 2020-08-23 VITALS — BP 116/75 | HR 41 | Temp 97.0°F | Ht 67.5 in | Wt 137.0 lb

## 2020-08-23 DIAGNOSIS — R319 Hematuria, unspecified: Secondary | ICD-10-CM

## 2020-08-23 DIAGNOSIS — M545 Low back pain, unspecified: Secondary | ICD-10-CM

## 2020-08-23 LAB — POCT URINALYSIS DIPSTICK
Bilirubin, UA: NEGATIVE
Blood, UA: POSITIVE
Glucose, UA: NEGATIVE
Ketones, UA: NEGATIVE
Leukocytes, UA: NEGATIVE
Nitrite, UA: NEGATIVE
Protein, UA: NEGATIVE
Spec Grav, UA: 1.01 (ref 1.010–1.025)
Urobilinogen, UA: 0.2 E.U./dL
pH, UA: 6.5 (ref 5.0–8.0)

## 2020-08-23 NOTE — Progress Notes (Signed)
   Subjective:    Patient ID: Dana Anderson, female    DOB: 1986/02/02, 34 y.o.   MRN: [id number]  HPI Here for 2 months of lower back pain which bothers her every day. This is very mild and occasionally she takes Advil for it. This seems to help. The pain is a dull aching discomfort that involves both side of the lower back , and lately it seems to radiate around to the right lower abdominal area. She does yoga and she runs 2-3 miles twice a week, and exercise does not make the pain any worse. She has no urinary symptoms now, but she did have 2 episodes of blood in the urine this past summer. Each of these was after a run, and she has not noticed any blood since June. Her BMs seem to be regular, and she has one every day. She had a normal GYN exam with a pelvic exam last week. She mentioned this pain to her gynecologist, and she thought the pain was "digestive" in nature. She takes no prescription medications, just a daily vitamin. She is not on birth control. Her menses are regular and have not changed.    Review of Systems  Constitutional: Negative.   Respiratory: Negative.   Cardiovascular: Negative.   Gastrointestinal: Positive for abdominal pain. Negative for abdominal distention, anal bleeding, blood in stool, constipation, diarrhea, nausea, rectal pain and vomiting.  Genitourinary: Negative.   Musculoskeletal: Positive for back pain.       Objective:   Physical Exam Constitutional:      Appearance: Normal appearance. She is well-developed. She is not ill-appearing.  Cardiovascular:     Rate and Rhythm: Normal rate and regular rhythm.     Pulses: Normal pulses.     Heart sounds: Normal heart sounds.  Pulmonary:     Effort: Pulmonary effort is normal.     Breath sounds: Normal breath sounds.  Abdominal:     General: Abdomen is flat. Bowel sounds are normal. There is no distension.     Palpations: Abdomen is soft. There is no mass.     Tenderness: There is no right CVA  tenderness, left CVA tenderness, guarding or rebound.     Hernia: No hernia is present.     Comments: Mildly tender in the central lower abdomen   Neurological:     Mental Status: She is alert.           Assessment & Plan:  Lower back pain with microscopic hematuria. It is not clear if the two are related. In case she is little constipated she will use Miralax with a full glass of water every day. We send the urine sample for a culture. Follow up next week.  Gershon Crane, MD

## 2020-08-24 LAB — URINE CULTURE
MICRO NUMBER:: 11220944
Result:: NO GROWTH
SPECIMEN QUALITY:: ADEQUATE

## 2020-08-27 NOTE — Addendum Note (Signed)
Addended by: Gershon Crane A on: 08/27/2020 01:07 PM   Modules accepted: Orders

## 2020-09-06 ENCOUNTER — Telehealth: Payer: Self-pay | Admitting: Adult Health

## 2020-09-06 NOTE — Telephone Encounter (Signed)
Pt is calling in to check the status of her referral and per Gavin Pound she will check on it and call the pt back.

## 2021-04-19 ENCOUNTER — Encounter: Payer: No Typology Code available for payment source | Admitting: Adult Health

## 2021-04-29 ENCOUNTER — Encounter: Payer: Self-pay | Admitting: Adult Health

## 2021-05-20 ENCOUNTER — Other Ambulatory Visit: Payer: Self-pay

## 2021-05-21 ENCOUNTER — Ambulatory Visit (INDEPENDENT_AMBULATORY_CARE_PROVIDER_SITE_OTHER): Payer: No Typology Code available for payment source | Admitting: Adult Health

## 2021-05-21 ENCOUNTER — Encounter: Payer: Self-pay | Admitting: Adult Health

## 2021-05-21 ENCOUNTER — Telehealth: Payer: Self-pay | Admitting: Adult Health

## 2021-05-21 VITALS — BP 90/80 | HR 70 | Temp 98.6°F | Ht 67.0 in | Wt 138.0 lb

## 2021-05-21 DIAGNOSIS — Z0000 Encounter for general adult medical examination without abnormal findings: Secondary | ICD-10-CM | POA: Diagnosis not present

## 2021-05-21 LAB — CBC WITH DIFFERENTIAL/PLATELET
Basophils Absolute: 0 10*3/uL (ref 0.0–0.1)
Basophils Relative: 0.4 % (ref 0.0–3.0)
Eosinophils Absolute: 0.2 10*3/uL (ref 0.0–0.7)
Eosinophils Relative: 4.3 % (ref 0.0–5.0)
HCT: 37.5 % (ref 36.0–46.0)
Hemoglobin: 12.4 g/dL (ref 12.0–15.0)
Lymphocytes Relative: 29.2 % (ref 12.0–46.0)
Lymphs Abs: 1.3 10*3/uL (ref 0.7–4.0)
MCHC: 33 g/dL (ref 30.0–36.0)
MCV: 88 fl (ref 78.0–100.0)
Monocytes Absolute: 0.4 10*3/uL (ref 0.1–1.0)
Monocytes Relative: 8.6 % (ref 3.0–12.0)
Neutro Abs: 2.5 10*3/uL (ref 1.4–7.7)
Neutrophils Relative %: 57.5 % (ref 43.0–77.0)
Platelets: 314 10*3/uL (ref 150.0–400.0)
RBC: 4.26 Mil/uL (ref 3.87–5.11)
RDW: 13.1 % (ref 11.5–15.5)
WBC: 4.4 10*3/uL (ref 4.0–10.5)

## 2021-05-21 LAB — COMPREHENSIVE METABOLIC PANEL
ALT: 13 U/L (ref 0–35)
AST: 13 U/L (ref 0–37)
Albumin: 4.4 g/dL (ref 3.5–5.2)
Alkaline Phosphatase: 46 U/L (ref 39–117)
BUN: 10 mg/dL (ref 6–23)
CO2: 29 mEq/L (ref 19–32)
Calcium: 9.4 mg/dL (ref 8.4–10.5)
Chloride: 104 mEq/L (ref 96–112)
Creatinine, Ser: 0.74 mg/dL (ref 0.40–1.20)
GFR: 105.21 mL/min (ref >60.00)
Glucose, Bld: 85 mg/dL (ref 70–99)
Potassium: 4.7 mEq/L (ref 3.5–5.1)
Sodium: 139 mEq/L (ref 135–145)
Total Bilirubin: 0.5 mg/dL (ref 0.2–1.2)
Total Protein: 6.5 g/dL (ref 6.0–8.3)

## 2021-05-21 LAB — LIPID PANEL
Cholesterol: 171 mg/dL (ref 0–200)
HDL: 61.1 mg/dL (ref >39.00)
LDL Cholesterol: 98 mg/dL (ref 0–99)
NonHDL: 110.14
Total CHOL/HDL Ratio: 3
Triglycerides: 59 mg/dL (ref 0.0–149.0)
VLDL: 11.8 mg/dL (ref 0.0–40.0)

## 2021-05-21 LAB — VITAMIN D 25 HYDROXY (VIT D DEFICIENCY, FRACTURES): VITD: 28.93 ng/mL — ABNORMAL LOW (ref 30.00–100.00)

## 2021-05-21 LAB — TSH: TSH: 2.71 u[IU]/mL (ref 0.35–5.50)

## 2021-05-21 NOTE — Progress Notes (Signed)
Subjective:    Patient ID: Dana Anderson, female    DOB: Sep 13, 1986, 35 y.o.   MRN: [id number]  HPI Patient presents for yearly preventative medicine examination. She is a pleasant 35 year old female who  has a past medical history of Gallstones (11/23/2015).   All immunizations and health maintenance protocols were reviewed with the patient and needed orders were placed.  Appropriate screening laboratory values were ordered for the patient including screening of hyperlipidemia, renal function and hepatic function.   Medication reconciliation,  past medical history, social history, problem list and allergies were reviewed in detail with the patient  Goals were established with regard to weight loss, exercise, and  diet in compliance with medications.  She is an avid runner and eats a heart healthy diet  She is up-to-date on routine GYN care as well as dental and vision screens.   Review of Systems  Constitutional: Negative.   HENT: Negative.    Eyes: Negative.   Respiratory: Negative.    Cardiovascular: Negative.   Gastrointestinal: Negative.   Endocrine: Negative.   Genitourinary: Negative.   Musculoskeletal:  Positive for back pain (low back pain - intermittently.).  Skin: Negative.   Allergic/Immunologic: Negative.   Neurological: Negative.   Hematological: Negative.   Psychiatric/Behavioral: Negative.      Past Medical History:  Diagnosis Date   Gallstones 11/23/2015    Social History   Socioeconomic History   Marital status: Married    Spouse name: Not on file   Number of children: Not on file   Years of education: Not on file   Highest education level: Not on file  Occupational History   Not on file  Tobacco Use   Smoking status: Never   Smokeless tobacco: Never  Substance and Sexual Activity   Alcohol use: Yes    Comment: socially only when not pregnant    Drug use: No   Sexual activity: Yes    Birth control/protection: None  Other Topics  Concern   Not on file  Social History Narrative   Works at [employer]    Three children    Social Determinants of Health   Financial Resource Strain: Not on file  Food Insecurity: Not on file  Transportation Needs: Not on file  Physical Activity: Not on file  Stress: Not on file  Social Connections: Not on file  Intimate Partner Violence: Not on file    Past Surgical History:  Procedure Laterality Date   CHOLECYSTECTOMY N/A 12/13/2015   Procedure: LAPAROSCOPIC CHOLECYSTECTOMY WITH INTRAOPERATIVE CHOLANGIOGRAM;  Surgeon: Avel Peace, MD;  Location: [facility] OR;  Service: General;  Laterality: N/A;   TONSILLECTOMY      Family History  Problem Relation Age of Onset   Diabetes Mother    Diabetes Maternal Grandmother    Diabetes Maternal Grandfather     No Known Allergies  No current outpatient medications on file prior to visit.   No current facility-administered medications on file prior to visit.    BP 90/80   Pulse 70   Temp 98.6 F (37 C) (Oral)   Ht 5\' 7"  (1.702 m)   Wt 138 lb (62.6 kg)   SpO2 98%   BMI 21.61 kg/m        Objective:   Physical Exam Vitals and nursing note reviewed.  Constitutional:      General: She is not in acute distress.    Appearance: Normal appearance. She is well-developed. She is not ill-appearing.  HENT:  Head: Normocephalic and atraumatic.     Right Ear: Tympanic membrane, ear canal and external ear normal. There is no impacted cerumen.     Left Ear: Tympanic membrane, ear canal and external ear normal. There is no impacted cerumen.     Nose: Nose normal. No congestion or rhinorrhea.     Mouth/Throat:     Mouth: Mucous membranes are moist.     Pharynx: Oropharynx is clear. No oropharyngeal exudate or posterior oropharyngeal erythema.  Eyes:     General:        Right eye: No discharge.        Left eye: No discharge.     Extraocular Movements: Extraocular movements intact.     Conjunctiva/sclera: Conjunctivae  normal.     Pupils: Pupils are equal, round, and reactive to light.  Neck:     Thyroid: No thyromegaly.     Vascular: No carotid bruit.     Trachea: No tracheal deviation.  Cardiovascular:     Rate and Rhythm: Normal rate and regular rhythm.     Pulses: Normal pulses.     Heart sounds: Normal heart sounds. No murmur heard.   No friction rub. No gallop.  Pulmonary:     Effort: Pulmonary effort is normal. No respiratory distress.     Breath sounds: Normal breath sounds. No stridor. No wheezing, rhonchi or rales.  Chest:     Chest wall: No tenderness.  Abdominal:     General: Abdomen is flat. Bowel sounds are normal. There is no distension.     Palpations: Abdomen is soft. There is no mass.     Tenderness: There is no abdominal tenderness. There is no right CVA tenderness, left CVA tenderness, guarding or rebound.     Hernia: No hernia is present.  Musculoskeletal:        General: No swelling, tenderness, deformity or signs of injury. Normal range of motion.     Cervical back: Normal range of motion and neck supple.     Right lower leg: No edema.     Left lower leg: No edema.  Lymphadenopathy:     Cervical: No cervical adenopathy.  Skin:    General: Skin is warm and dry.     Coloration: Skin is not jaundiced or pale.     Findings: No bruising, erythema, lesion or rash.  Neurological:     General: No focal deficit present.     Mental Status: She is alert and oriented to person, place, and time.     Cranial Nerves: No cranial nerve deficit.     Sensory: No sensory deficit.     Motor: No weakness.     Coordination: Coordination normal.     Gait: Gait normal.     Deep Tendon Reflexes: Reflexes normal.  Psychiatric:        Mood and Affect: Mood normal.        Behavior: Behavior normal.        Thought Content: Thought content normal.        Judgment: Judgment normal.      Assessment & Plan:  1. Routine general medical examination at a health care facility - benign exam.  Healthy 35 year old female  - Follow up in one year or sooner if needed - TSH; Future - Lipid panel; Future - CMP; Future - CBC with Differential/Platelets; Future - Hep C Antibody; Future - Vitamin D, 25-hydroxy; Future  Shirline Frees, NP

## 2021-05-21 NOTE — Patient Instructions (Addendum)
It was great seeing you today   Call [facility]  Address: [address] [place], [place], [place] [address] Phone: [phone]  We will follow up with you regarding your labs

## 2021-05-21 NOTE — Telephone Encounter (Signed)
Please advise 

## 2021-05-21 NOTE — Telephone Encounter (Signed)
Patient saw Kandee Keen today as a New Patient.  She is wanting her husband to be a patient as well.  Can I schedule him an appointment with Kandee Keen?

## 2021-05-21 NOTE — Addendum Note (Signed)
Addended by: Kandra Nicolas on: 05/21/2021 08:27 AM   Modules accepted: Orders

## 2021-05-22 LAB — HEPATITIS C ANTIBODY
Hepatitis C Ab: NONREACTIVE
SIGNAL TO CUT-OFF: 0.01 (ref <1.00)

## 2021-05-22 NOTE — Telephone Encounter (Signed)
Patient notified of update  and verbalized understanding. Pt will call back to schedule new pt appt.

## 2021-05-24 ENCOUNTER — Telehealth: Payer: Self-pay

## 2021-05-24 NOTE — Telephone Encounter (Signed)
Pt notified of update.  

## 2021-05-24 NOTE — Telephone Encounter (Signed)
Called pt to advised that Health screening for [facility] has been faxed. Did not receive a vm beep. Unable to leave message. Will try again later.

## 2021-06-26 ENCOUNTER — Encounter: Payer: Self-pay | Admitting: Adult Health

## 2021-06-26 ENCOUNTER — Ambulatory Visit: Payer: No Typology Code available for payment source | Admitting: Family Medicine

## 2021-06-26 NOTE — Telephone Encounter (Signed)
Pt states that since she couldn't get an appt until 3:45 she went to [facility]. States she was diagnosed with poison ivy & given prednisone. Pt advised to f/u with PCP if no improvement noted or if symptoms worsen. Pt verb understanding.

## 2021-07-30 ENCOUNTER — Encounter: Payer: Self-pay | Admitting: Adult Health

## 2021-08-01 NOTE — Telephone Encounter (Signed)
This has been taking care of. I called pt and she stated that she is unable to get in touch with anyone from [facility] dermatology. I advised pt that some of our pt are sent to [facility] dermatology. Pt was ok with the switch and will reach out to them today.

## 2021-08-06 ENCOUNTER — Other Ambulatory Visit: Payer: Self-pay

## 2021-08-07 ENCOUNTER — Encounter: Payer: Self-pay | Admitting: Adult Health

## 2021-08-07 ENCOUNTER — Ambulatory Visit: Payer: No Typology Code available for payment source | Admitting: Adult Health

## 2021-08-07 VITALS — BP 90/70 | HR 62 | Temp 98.0°F | Ht 67.0 in | Wt 138.0 lb

## 2021-08-07 DIAGNOSIS — T380X5A Adverse effect of glucocorticoids and synthetic analogues, initial encounter: Secondary | ICD-10-CM | POA: Diagnosis not present

## 2021-08-07 DIAGNOSIS — H6983 Other specified disorders of Eustachian tube, bilateral: Secondary | ICD-10-CM

## 2021-08-07 NOTE — Progress Notes (Signed)
Subjective:    Patient ID: Dana Anderson, female    DOB: 06/11/1986, 35 y.o.   MRN: [id number]  HPI  35 year old female who  has a past medical history of Gallstones (11/23/2015).  She was seen on 06/26/2021 for a rash on her ankles that had spread upward to the torso and neck.  She does report getting entangled in poison ivy.  Urgent care she was prescribed a prednisone taper of 40 mg for 5 days then 20 mg for 5 days then 10 mg for 5 days.  Since taking prednisone she has been experiencing brain fog, had mild dizziness which seems to resolve, and intermittent sensation of tingling in her feet and toes.  She has also developed a feeling of ear fullness were sounds seem to be more exaggerated on the right side.  Some days she will feel fine and other days she will feel fatigued and have the brain fog.  Symptoms seem to be improving to some degree since prednisone therapy has been completed?  Review of Systems See HPI   Past Medical History:  Diagnosis Date   Gallstones 11/23/2015    Social History   Socioeconomic History   Marital status: Married    Spouse name: Not on file   Number of children: Not on file   Years of education: Not on file   Highest education level: Not on file  Occupational History   Not on file  Tobacco Use   Smoking status: Never   Smokeless tobacco: Never  Substance and Sexual Activity   Alcohol use: Yes    Comment: socially only when not pregnant    Drug use: No   Sexual activity: Yes    Birth control/protection: None  Other Topics Concern   Not on file  Social History Narrative   Works at [employer]    Three children    Social Determinants of Health   Financial Resource Strain: Not on file  Food Insecurity: Not on file  Transportation Needs: Not on file  Physical Activity: Not on file  Stress: Not on file  Social Connections: Not on file  Intimate Partner Violence: Not on file    Past Surgical History:  Procedure  Laterality Date   CHOLECYSTECTOMY N/A 12/13/2015   Procedure: LAPAROSCOPIC CHOLECYSTECTOMY WITH INTRAOPERATIVE CHOLANGIOGRAM;  Surgeon: Avel Peace, MD;  Location: [facility] OR;  Service: General;  Laterality: N/A;   TONSILLECTOMY      Family History  Problem Relation Age of Onset   Diabetes Mother    Diabetes Maternal Grandmother    Diabetes Maternal Grandfather     No Known Allergies  No current outpatient medications on file prior to visit.   No current facility-administered medications on file prior to visit.    BP 90/70   Pulse 62   Temp 98 F (36.7 C) (Oral)   Ht 5\' 7"  (1.702 m)   Wt 138 lb (62.6 kg)   SpO2 97%   BMI 21.61 kg/m       Objective:   Physical Exam Vitals and nursing note reviewed.  Constitutional:      Appearance: Normal appearance.  HENT:     Right Ear: Ear canal and external ear normal. A middle ear effusion is present. There is no impacted cerumen. Tympanic membrane is not erythematous or bulging.     Left Ear: Ear canal and external ear normal. A middle ear effusion is present. There is no impacted cerumen. Tympanic membrane is not erythematous  or bulging.     Nose: Nose normal.  Cardiovascular:     Rate and Rhythm: Normal rate and regular rhythm.     Pulses: Normal pulses.     Heart sounds: Normal heart sounds.  Pulmonary:     Effort: Pulmonary effort is normal.     Breath sounds: Normal breath sounds.  Musculoskeletal:        General: Normal range of motion.  Skin:    General: Skin is warm and dry.  Neurological:     General: No focal deficit present.     Mental Status: She is alert and oriented to person, place, and time.      Assessment & Plan:  1. Prednisone adverse reaction, initial encounter -Likely due to prednisone therapy.  Advise watchful waiting, symptoms should start to resolve completely in the next 7 to 10 days.  Follow-up if no improvement  2. Eustachian tube dysfunction, bilateral -Advised Flonase.  Shirline Frees,  NP

## 2021-08-19 ENCOUNTER — Ambulatory Visit: Payer: No Typology Code available for payment source

## 2021-11-01 ENCOUNTER — Encounter: Payer: Self-pay | Admitting: Adult Health

## 2021-11-05 NOTE — Telephone Encounter (Signed)
FYI  Pt scheduled for 7am tomorrow

## 2021-11-06 ENCOUNTER — Ambulatory Visit (INDEPENDENT_AMBULATORY_CARE_PROVIDER_SITE_OTHER): Payer: No Typology Code available for payment source

## 2021-11-06 ENCOUNTER — Encounter: Payer: Self-pay | Admitting: Adult Health

## 2021-11-06 ENCOUNTER — Other Ambulatory Visit: Payer: Self-pay

## 2021-11-06 ENCOUNTER — Ambulatory Visit: Payer: No Typology Code available for payment source | Admitting: Adult Health

## 2021-11-06 VITALS — BP 100/80 | HR 62 | Temp 97.7°F | Ht 67.0 in | Wt 138.0 lb

## 2021-11-06 DIAGNOSIS — R200 Anesthesia of skin: Secondary | ICD-10-CM

## 2021-11-06 DIAGNOSIS — R202 Paresthesia of skin: Secondary | ICD-10-CM

## 2021-11-06 LAB — COMPREHENSIVE METABOLIC PANEL
ALT: 11 U/L (ref 0–35)
AST: 14 U/L (ref 0–37)
Albumin: 4.8 g/dL (ref 3.5–5.2)
Alkaline Phosphatase: 43 U/L (ref 39–117)
BUN: 11 mg/dL (ref 6–23)
CO2: 28 mEq/L (ref 19–32)
Calcium: 9.5 mg/dL (ref 8.4–10.5)
Chloride: 106 mEq/L (ref 96–112)
Creatinine, Ser: 0.82 mg/dL (ref 0.40–1.20)
GFR: 92.72 mL/min (ref >60.00)
Glucose, Bld: 90 mg/dL (ref 70–99)
Potassium: 4.3 mEq/L (ref 3.5–5.1)
Sodium: 139 mEq/L (ref 135–145)
Total Bilirubin: 0.4 mg/dL (ref 0.2–1.2)
Total Protein: 7.1 g/dL (ref 6.0–8.3)

## 2021-11-06 LAB — CBC WITH DIFFERENTIAL/PLATELET
Basophils Absolute: 0 10*3/uL (ref 0.0–0.1)
Basophils Relative: 0.7 % (ref 0.0–3.0)
Eosinophils Absolute: 0.2 10*3/uL (ref 0.0–0.7)
Eosinophils Relative: 3.7 % (ref 0.0–5.0)
HCT: 40.2 % (ref 36.0–46.0)
Hemoglobin: 13.3 g/dL (ref 12.0–15.0)
Lymphocytes Relative: 26.9 % (ref 12.0–46.0)
Lymphs Abs: 1.4 10*3/uL (ref 0.7–4.0)
MCHC: 33.1 g/dL (ref 30.0–36.0)
MCV: 88.7 fl (ref 78.0–100.0)
Monocytes Absolute: 0.5 10*3/uL (ref 0.1–1.0)
Monocytes Relative: 8.8 % (ref 3.0–12.0)
Neutro Abs: 3.1 10*3/uL (ref 1.4–7.7)
Neutrophils Relative %: 59.9 % (ref 43.0–77.0)
Platelets: 237 10*3/uL (ref 150.0–400.0)
RBC: 4.53 Mil/uL (ref 3.87–5.11)
RDW: 13.8 % (ref 11.5–15.5)
WBC: 5.2 10*3/uL (ref 4.0–10.5)

## 2021-11-06 LAB — VITAMIN D 25 HYDROXY (VIT D DEFICIENCY, FRACTURES): VITD: 41.85 ng/mL (ref 30.00–100.00)

## 2021-11-06 LAB — IBC + FERRITIN
Ferritin: 20.5 ng/mL (ref 10.0–291.0)
Iron: 40 ug/dL — ABNORMAL LOW (ref 42–145)
Saturation Ratios: 8.8 % — ABNORMAL LOW (ref 20.0–50.0)
TIBC: 452.2 ug/dL — ABNORMAL HIGH (ref 250.0–450.0)
Transferrin: 323 mg/dL (ref 212.0–360.0)

## 2021-11-06 LAB — MAGNESIUM: Magnesium: 1.9 mg/dL (ref 1.5–2.5)

## 2021-11-06 LAB — VITAMIN B12: Vitamin B-12: 245 pg/mL (ref 211–911)

## 2021-11-06 NOTE — Progress Notes (Signed)
Subjective:    Patient ID: Dana Anderson, female    DOB: October 13, 1985, 36 y.o.   MRN: [id number]  HPI 36 year old female who  has a past medical history of Gallstones (11/23/2015).  She presents to the office today for an acute issue. Over the last week or so she had been experiencing a " numbness and tingling sensation throughout the day. This sensation was in both arms and legs. She denies nausea/vomiting/loss of function, brain fog, headaches, palpitations,chest pain,  or blurred vision.   She started a MVI about 60 days ago and his since stopped it with improvement in her symptoms. She still has intermittent numbness and tingling but this happens maybe twice a day and lasts a couple of minutes at a time.   Her only other complaint is that of feeling more cold than normal   Lab Results  Component Value Date   TSH 2.71 05/21/2021    Review of Systems See HPI   Past Medical History:  Diagnosis Date   Gallstones 11/23/2015    Social History   Socioeconomic History   Marital status: Married    Spouse name: Not on file   Number of children: Not on file   Years of education: Not on file   Highest education level: Master's degree (e.g., MA, MS, MEng, MEd, MSW, MBA)  Occupational History   Not on file  Tobacco Use   Smoking status: Never   Smokeless tobacco: Never  Substance and Sexual Activity   Alcohol use: Yes    Comment: socially only when not pregnant    Drug use: No   Sexual activity: Yes    Birth control/protection: None  Other Topics Concern   Not on file  Social History Narrative   Works at [employer]    Three children    Social Determinants of Health   Financial Resource Strain: Low Risk    Difficulty of Paying Living Expenses: Not hard at all  Food Insecurity: No Food Insecurity   Worried About Programme researcher, broadcasting/film/video in the Last Year: Never true   Barista in the Last Year: Never true  Transportation Needs: No Transportation  Needs   Lack of Transportation (Medical): No   Lack of Transportation (Non-Medical): No  Physical Activity: Sufficiently Active   Days of Exercise per Week: 4 days   Minutes of Exercise per Session: 40 min  Stress: No Stress Concern Present   Feeling of Stress : Only a little  Social Connections: Moderately Integrated   Frequency of Communication with Friends and Family: More than three times a week   Frequency of Social Gatherings with Friends and Family: Once a week   Attends Religious Services: 1 to 4 times per year   Active Member of [employer] or Organizations: No   Attends Engineer, structural: Not on file   Marital Status: Married  Catering manager Violence: Not on file    Past Surgical History:  Procedure Laterality Date   CHOLECYSTECTOMY N/A 12/13/2015   Procedure: LAPAROSCOPIC CHOLECYSTECTOMY WITH INTRAOPERATIVE CHOLANGIOGRAM;  Surgeon: Avel Peace, MD;  Location: [facility] OR;  Service: General;  Laterality: N/A;   TONSILLECTOMY      Family History  Problem Relation Age of Onset   Diabetes Mother    Diabetes Maternal Grandmother    Diabetes Maternal Grandfather     No Known Allergies  Current Outpatient Medications on File Prior to Visit  Medication Sig Dispense Refill   cholecalciferol (  VITAMIN D3) 25 MCG (1000 UNIT) tablet Take 1,000 Units by mouth daily.     Multiple Vitamin (MULTIVITAMIN) capsule Take 1 capsule by mouth daily.     Omega-3 1000 MG CAPS Take by mouth.     No current facility-administered medications on file prior to visit.    BP 100/80    Pulse 62    Temp 97.7 F (36.5 C) (Oral)    Ht 5\' 7"  (1.702 m)    Wt 138 lb (62.6 kg)    SpO2 100%    BMI 21.61 kg/m       Objective:   Physical Exam Vitals and nursing note reviewed.  Constitutional:      Appearance: Normal appearance.  Cardiovascular:     Rate and Rhythm: Normal rate and regular rhythm.     Pulses: Normal pulses.     Heart sounds: Normal heart sounds.  Pulmonary:     Effort:  Pulmonary effort is normal.     Breath sounds: Normal breath sounds.  Skin:    General: Skin is warm and dry.  Neurological:     General: No focal deficit present.     Mental Status: She is alert and oriented to person, place, and time.     Cranial Nerves: No cranial nerve deficit.     Sensory: No sensory deficit.     Motor: No weakness.     Coordination: Coordination normal.     Gait: Gait normal.     Deep Tendon Reflexes: Reflexes normal.  Psychiatric:        Mood and Affect: Mood normal.        Behavior: Behavior normal.        Thought Content: Thought content normal.        Judgment: Judgment normal.      Assessment & Plan:  1. Numbness and tingling - likely vitamin toxicity? Will check lumbar and cervical spine xrays to r/o radiculopathy  - Vitamin D, 25-hydroxy; Future - Vitamin E; Future - Vitamin B12; Future - Magnesium; Future - Comprehensive metabolic panel; Future - CBC with Differential/Platelet; Future - IBC + Ferritin; Future - DG Lumbar Spine Complete; Future - DG Cervical Spine Complete; Future - Ceruloplasmin; Future  , NP  Time spent on chart review, time with patient; discussion of symptoms, vitamin Toxicity, radiculopathy, home monitoring reatment, follow up plan, and documentation 30 minutes

## 2021-11-07 ENCOUNTER — Telehealth: Payer: Self-pay | Admitting: Adult Health

## 2021-11-07 DIAGNOSIS — N200 Calculus of kidney: Secondary | ICD-10-CM

## 2021-11-07 DIAGNOSIS — R1084 Generalized abdominal pain: Secondary | ICD-10-CM

## 2021-11-07 NOTE — Telephone Encounter (Signed)
Updated patient on labs and xrays   Lumbar spine xray shows multiple scattered kidney stones.   Will order CT scan and have her follow up with her urologist   Add b12 supplement

## 2021-11-08 LAB — CERULOPLASMIN: Ceruloplasmin: 19 mg/dL (ref 18–53)

## 2021-11-08 LAB — VITAMIN E
Gamma-Tocopherol (Vit E): <1 mg/L (ref <=4.3)
Vitamin E (Alpha Tocopherol): 12 mg/L (ref 5.7–19.9)

## 2021-11-19 ENCOUNTER — Other Ambulatory Visit: Payer: No Typology Code available for payment source

## 2021-11-21 ENCOUNTER — Encounter: Payer: Self-pay | Admitting: Radiology

## 2021-11-21 ENCOUNTER — Ambulatory Visit
Admission: RE | Admit: 2021-11-21 | Discharge: 2021-11-21 | Disposition: A | Discharge status: Home / Self Care | Payer: No Typology Code available for payment source | Source: Ambulatory Visit | Attending: Adult Health | Admitting: Adult Health

## 2021-11-21 ENCOUNTER — Other Ambulatory Visit: Payer: Self-pay

## 2021-11-21 DIAGNOSIS — N200 Calculus of kidney: Secondary | ICD-10-CM

## 2021-11-21 DIAGNOSIS — R1084 Generalized abdominal pain: Secondary | ICD-10-CM

## 2021-12-25 ENCOUNTER — Encounter: Payer: Self-pay | Admitting: Adult Health

## 2022-08-29 ENCOUNTER — Telehealth: Payer: No Typology Code available for payment source | Admitting: Physician Assistant

## 2022-08-29 DIAGNOSIS — R3989 Other symptoms and signs involving the genitourinary system: Secondary | ICD-10-CM

## 2022-08-29 MED ORDER — CEPHALEXIN 500 MG PO CAPS: 500.0000 mg | ORAL_CAPSULE | Freq: Two times a day (BID) | ORAL | 0 refills | Status: DC

## 2022-08-29 NOTE — Progress Notes (Signed)

## 2022-09-09 ENCOUNTER — Ambulatory Visit: Payer: No Typology Code available for payment source | Admitting: Plastic Surgery

## 2022-09-09 ENCOUNTER — Encounter: Payer: Self-pay | Admitting: Plastic Surgery

## 2022-09-09 VITALS — BP 106/69 | HR 61 | Ht 67.0 in | Wt 143.0 lb

## 2022-09-09 DIAGNOSIS — L989 Disorder of the skin and subcutaneous tissue, unspecified: Secondary | ICD-10-CM | POA: Diagnosis not present

## 2022-09-09 NOTE — Progress Notes (Signed)
     Patient ID: Dana Anderson, female    DOB: [birth year], 36 y.o.   MRN: [id number]   Chief Complaint  Patient presents with   Consult    The patient is a 36 year old female here for evaluation of her skin.  The patient has a lesion on her forehead.  It has been present for several months and seems to be getting larger.  Sometimes she is able to manipulate it and it seems to clear up but comes right back and gets very irritated.  She is otherwise in good health.  She has had a cholecystectomy and a tonsillectomy.  She does not have any ongoing medical problems.  She does not have a history of skin cancer.    Review of Systems  Constitutional: Negative.   Eyes: Negative.   Respiratory: Negative.    Cardiovascular: Negative.   Gastrointestinal: Negative.   Endocrine: Negative.   Genitourinary: Negative.   Musculoskeletal: Negative.     Past Medical History:  Diagnosis Date   Gallstones 11/23/2015    Past Surgical History:  Procedure Laterality Date   CHOLECYSTECTOMY N/A 12/13/2015   Procedure: LAPAROSCOPIC CHOLECYSTECTOMY WITH INTRAOPERATIVE CHOLANGIOGRAM;  Surgeon: Avel Peace, MD;  Location: [facility] OR;  Service: General;  Laterality: N/A;   TONSILLECTOMY       No current outpatient medications on file.   Objective:   Vitals:   09/09/22 1300  BP: 106/69  Pulse: 61  SpO2: 99%    Physical Exam Vitals reviewed.  Constitutional:      Appearance: Normal appearance.  Cardiovascular:     Rate and Rhythm: Normal rate.     Pulses: Normal pulses.  Pulmonary:     Effort: Pulmonary effort is normal.  Skin:    General: Skin is warm.     Capillary Refill: Capillary refill takes less than 2 seconds.     Coloration: Skin is not jaundiced.     Findings: Lesion present. No bruising.  Neurological:     Mental Status: She is alert and oriented to person, place, and time.  Psychiatric:        Mood and Affect: Mood normal.        Behavior: Behavior normal.         Thought Content: Thought content normal.     Assessment & Plan:  Changing skin lesion  Plan for excision of changing skin lesion of her forehead.  She will have a scar but it should heal nicely without any residual effects.  We also talked about the need to work on her pores in general and the laser may be a great option for her.  Pictures were obtained of the patient and placed in the chart with the patient's or guardian's permission.   Alena Bills Izael Bessinger, DO

## 2022-09-12 ENCOUNTER — Encounter: Payer: Self-pay | Admitting: Plastic Surgery

## 2022-09-12 ENCOUNTER — Other Ambulatory Visit (HOSPITAL_COMMUNITY)
Admission: RE | Admit: 2022-09-12 | Discharge: 2022-09-12 | Disposition: A | Discharge status: Home / Self Care | Payer: No Typology Code available for payment source | Source: Ambulatory Visit | Attending: Plastic Surgery | Admitting: Plastic Surgery

## 2022-09-12 ENCOUNTER — Ambulatory Visit (INDEPENDENT_AMBULATORY_CARE_PROVIDER_SITE_OTHER): Payer: No Typology Code available for payment source | Admitting: Plastic Surgery

## 2022-09-12 VITALS — BP 121/79 | HR 47

## 2022-09-12 DIAGNOSIS — L989 Disorder of the skin and subcutaneous tissue, unspecified: Secondary | ICD-10-CM

## 2022-09-12 DIAGNOSIS — L738 Other specified follicular disorders: Secondary | ICD-10-CM

## 2022-09-12 NOTE — Progress Notes (Signed)
Procedure Note  Preoperative Dx: changing skin lesion of forehead  Postoperative Dx: Same  Procedure: Excision of changing skin lesion of forehead 2 mm  Anesthesia: Lidocaine 1% with 1:100,000 epinephrine  Indication for Procedure: skin lesion  Description of Procedure: Risks and complications were explained to the patient.  Consent was confirmed and the patient understands the risks and benefits.  The potential complications and alternatives were explained and the patient consents.  The patient expressed understanding the option of not having the procedure and the risks of a scar.  Time out was called and all information was confirmed to be correct.    The area was prepped and drapped.  Lidocaine 1% with epinepherine was injected in the subcutaneous area.  After waiting several minutes for the local to take affect a #15 blade was used to excise the area in an eliptical pattern.  The skin edges were reapproximated with 5-0 Monocryl.  A dressing was applied.  The patient was given instructions on how to care for the area and a follow up appointment.  Dana Anderson tolerated the procedure well and there were no complications. The specimen was sent to pathology.

## 2022-09-13 ENCOUNTER — Ambulatory Visit: Payer: No Typology Code available for payment source | Admitting: Plastic Surgery

## 2022-09-16 LAB — SURGICAL PATHOLOGY

## 2022-09-22 ENCOUNTER — Telehealth: Payer: Self-pay | Admitting: *Deleted

## 2022-09-22 NOTE — Telephone Encounter (Signed)
Call placed to patient to notify of surgical pathology results. Pt had seen results in mychart and did not have any questions or concerns.

## 2022-09-23 ENCOUNTER — Encounter: Payer: Self-pay | Admitting: Surgical

## 2022-09-23 ENCOUNTER — Ambulatory Visit (INDEPENDENT_AMBULATORY_CARE_PROVIDER_SITE_OTHER): Payer: No Typology Code available for payment source | Admitting: Surgical

## 2022-09-23 VITALS — BP 111/75 | HR 47

## 2022-09-23 DIAGNOSIS — L989 Disorder of the skin and subcutaneous tissue, unspecified: Secondary | ICD-10-CM

## 2022-09-23 NOTE — Progress Notes (Signed)
Patient 36 year old female here for follow-up after excision of changing skin lesion of forehead 2 mm with Dr. Ulice Bold on 09/12/2022.  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  No malignancy identified.  Patient is doing well.  She has kept a Steri-Strip in place since the area was excised.  She has some questions about scarring.  On exam forehead scar is healing well, Monocryl suture knot is noted.  There is no erythema or cellulitic changes noted.  There is some mild scabbing noted.  No ecchymosis or swelling is noted.  A/P:  New Steri-Strip was applied, recommend beginning use scar cream over the next 5 to 7 days. We discussed various scar creams.  Recommend most importantly use of sunscreen to prevent discoloration of the scar. All of the patient's questions were answered to her content.  No signs of infection on exam.  We will plan to see her back as needed.

## 2022-10-09 ENCOUNTER — Encounter: Payer: No Typology Code available for payment source | Admitting: Adult Health

## 2022-11-06 ENCOUNTER — Encounter: Payer: Self-pay | Admitting: Adult Health

## 2022-11-06 ENCOUNTER — Ambulatory Visit: Payer: No Typology Code available for payment source | Admitting: Adult Health

## 2022-11-06 VITALS — BP 98/62 | HR 50 | Temp 97.8°F | Ht 67.0 in | Wt 139.0 lb

## 2022-11-06 DIAGNOSIS — S61012A Laceration without foreign body of left thumb without damage to nail, initial encounter: Secondary | ICD-10-CM

## 2022-11-06 NOTE — Progress Notes (Signed)
Subjective:    Patient ID: Dana Anderson, female    DOB: Dec 27, 1985, 37 y.o.   MRN: [id number]  HPI 37 year old female who  has a past medical history of Gallstones (11/23/2015).  She presents to the office today for suture removal.  She was seen at urgent care 11 days ago after she sliced her left thumb on the dorsal aspect with a knife while making a salad.  She is up-to-date on her Tdap  She had 4 sutures placed for a 2.5 cm laceration  Review of Systems See HPI   Past Medical History:  Diagnosis Date   Gallstones 11/23/2015    Social History   Socioeconomic History   Marital status: Married    Spouse name: Not on file   Number of children: Not on file   Years of education: Not on file   Highest education level: Master's degree (e.g., MA, MS, MEng, MEd, MSW, MBA)  Occupational History   Not on file  Tobacco Use   Smoking status: Never   Smokeless tobacco: Never  Substance and Sexual Activity   Alcohol use: Yes    Comment: socially only when not pregnant    Drug use: No   Sexual activity: Yes    Birth control/protection: None  Other Topics Concern   Not on file  Social History Narrative   Works at [employer]    Three children    Social Determinants of Health   Financial Resource Strain: [place]  (11/05/2021)   Overall Financial Resource Strain (CARDIA)    Difficulty of Paying Living Expenses: Not hard at all  Food Insecurity: No Food Insecurity (11/05/2021)   Hunger Vital Sign    Worried About Running Out of Food in the Last Year: Never true    [place] in the Last Year: Never true  Transportation Needs: No Transportation Needs (11/05/2021)   PRAPARE - Hydrologist (Medical): No    Lack of Transportation (Non-Medical): No  Physical Activity: Sufficiently Active (11/05/2021)   Exercise Vital Sign    Days of Exercise per Week: 4 days    Minutes of Exercise per Session: 40 min  Stress: No Stress Concern  Present (11/05/2021)   [place]    Feeling of Stress : Only a little  Social Connections: Moderately Integrated (11/05/2021)   Social Connection and Isolation Panel [NHANES]    Frequency of Communication with Friends and Family: More than three times a week    Frequency of Social Gatherings with Friends and Family: Once a week    Attends Religious Services: 1 to 4 times per year    Active Member of [employer] or Organizations: No    Attends Music therapist: Not on file    Marital Status: Married  Human resources officer Violence: Not on file    Past Surgical History:  Procedure Laterality Date   CHOLECYSTECTOMY N/A 12/13/2015   Procedure: LAPAROSCOPIC CHOLECYSTECTOMY WITH INTRAOPERATIVE CHOLANGIOGRAM;  Surgeon: Jackolyn Confer, MD;  Location: [facility];  Service: General;  Laterality: N/A;   TONSILLECTOMY      Family History  Problem Relation Age of Onset   Diabetes Mother    Diabetes Maternal Grandmother    Diabetes Maternal Grandfather     No Known Allergies  No current outpatient medications on file prior to visit.   No current facility-administered medications on file prior to visit.    BP  98/62   Pulse (!) 50   Temp 97.8 F (36.6 C) (Oral)   Ht 5\' 7"  (1.702 m)   Wt 139 lb (63 kg)   LMP 11/03/2022   SpO2 98%   BMI 21.77 kg/m       Objective:   Physical Exam Vitals and nursing note reviewed.  Constitutional:      Appearance: Normal appearance.  Skin:    General: Skin is warm and dry.     Comments: 4 sutures in place on left thumb. No signs of infection. Wound appears well healed  Neurological:     General: No focal deficit present.     Mental Status: She is alert and oriented to person, place, and time.  Psychiatric:        Mood and Affect: Mood normal.        Behavior: Behavior normal.        Thought Content: Thought content normal.        Judgment: Judgment normal.        Assessment  & Plan:  1. Laceration of left thumb without foreign body without damage to nail, initial encounter - verbal consent obtained- 4 sutures were removed without difficulty. Patient tolerated procedure well    Dorothyann Peng, NP

## 2022-11-12 ENCOUNTER — Ambulatory Visit (INDEPENDENT_AMBULATORY_CARE_PROVIDER_SITE_OTHER): Payer: No Typology Code available for payment source | Admitting: Adult Health

## 2022-11-12 VITALS — BP 100/70 | Temp 97.7°F | Ht 66.75 in | Wt 141.0 lb

## 2022-11-12 DIAGNOSIS — N200 Calculus of kidney: Secondary | ICD-10-CM | POA: Diagnosis not present

## 2022-11-12 DIAGNOSIS — Z0000 Encounter for general adult medical examination without abnormal findings: Secondary | ICD-10-CM | POA: Diagnosis not present

## 2022-11-12 NOTE — Progress Notes (Signed)
Subjective:    Patient ID: Dana Anderson, female    DOB: 08/01/1986, 37 y.o.   MRN: [id number]  HPI Patient presents for yearly preventative medicine examination. She is a pleasant 37 year old female who  has a past medical history of Gallstones (11/23/2015).  History of kidney stones - was seen by Urology in March 2023, had CT renal study done around this time which showed multiple non obstructing renal calculi with the largest measuring 4-6 mm in each kidney. She did a 24 hour urine ( I do not have these results), was advised to follow up for treatment options but fell ill and could not go to the appointment. She has not had any symptoms   All immunizations and health maintenance protocols were reviewed with the patient and needed orders were placed.  Appropriate screening laboratory values were ordered for the patient including screening of hyperlipidemia, renal function and hepatic function.  Medication reconciliation,  past medical history, social history, problem list and allergies were reviewed in detail with the patient  Goals were established with regard to weight loss, exercise, and  diet in compliance with medications. She eats a healthy diet and is an avid runner  She is up to date on GYN care  Review of Systems  Constitutional: Negative.   HENT: Negative.    Eyes: Negative.   Respiratory: Negative.    Cardiovascular: Negative.   Gastrointestinal: Negative.   Endocrine: Negative.   Genitourinary: Negative.   Musculoskeletal: Negative.   Skin: Negative.   Allergic/Immunologic: Negative.   Neurological: Negative.   Hematological: Negative.   Psychiatric/Behavioral: Negative.     Past Medical History:  Diagnosis Date   Gallstones 11/23/2015    Social History   Socioeconomic History   Marital status: Married    Spouse name: Not on file   Number of children: Not on file   Years of education: Not on file   Highest education level: Master's degree (e.g.,  MA, MS, MEng, MEd, MSW, MBA)  Occupational History   Not on file  Tobacco Use   Smoking status: Never   Smokeless tobacco: Never  Substance and Sexual Activity   Alcohol use: Yes    Comment: socially only when not pregnant    Drug use: No   Sexual activity: Yes    Birth control/protection: None  Other Topics Concern   Not on file  Social History Narrative   Works at [employer]    Three children    Social Determinants of Health   Financial Resource Strain: [place]  (11/05/2021)   Overall Financial Resource Strain (CARDIA)    Difficulty of Paying Living Expenses: Not hard at all  Food Insecurity: No Food Insecurity (11/05/2021)   Hunger Vital Sign    Worried About Running Out of Food in the Last Year: Never true    [place] in the Last Year: Never true  Transportation Needs: No Transportation Needs (11/05/2021)   PRAPARE - Hydrologist (Medical): No    Lack of Transportation (Non-Medical): No  Physical Activity: Sufficiently Active (11/05/2021)   Exercise Vital Sign    Days of Exercise per Week: 4 days    Minutes of Exercise per Session: 40 min  Stress: No Stress Concern Present (11/05/2021)   [place]    Feeling of Stress : Only a little  Social Connections: Moderately Integrated (11/05/2021)   Social Connection and Isolation  Panel [NHANES]    Frequency of Communication with Friends and Family: More than three times a week    Frequency of Social Gatherings with Friends and Family: Once a week    Attends Religious Services: 1 to 4 times per year    Active Member of [employer] or Organizations: No    Attends Music therapist: Not on file    Marital Status: Married  Human resources officer Violence: Not on file    Past Surgical History:  Procedure Laterality Date   CHOLECYSTECTOMY N/A 12/13/2015   Procedure: LAPAROSCOPIC CHOLECYSTECTOMY WITH INTRAOPERATIVE  CHOLANGIOGRAM;  Surgeon: Jackolyn Confer, MD;  Location: [facility];  Service: General;  Laterality: N/A;   TONSILLECTOMY      Family History  Problem Relation Age of Onset   Diabetes Mother    Diabetes Maternal Grandmother    Diabetes Maternal Grandfather     No Known Allergies  No current outpatient medications on file prior to visit.   No current facility-administered medications on file prior to visit.    BP 100/70   Temp 97.7 F (36.5 C) (Oral)   Ht 5' 6.75" (1.695 m)   Wt 141 lb (64 kg)   LMP 11/03/2022   BMI 22.25 kg/m      Objective:   Physical Exam Vitals and nursing note reviewed.  Constitutional:      General: She is not in acute distress.    Appearance: Normal appearance. She is well-developed. She is not ill-appearing.  HENT:     Head: Normocephalic and atraumatic.     Right Ear: Tympanic membrane, ear canal and external ear normal. There is no impacted cerumen.     Left Ear: Tympanic membrane, ear canal and external ear normal. There is no impacted cerumen.     Nose: Nose normal. No congestion or rhinorrhea.     Mouth/Throat:     Mouth: Mucous membranes are moist.     Pharynx: Oropharynx is clear. No oropharyngeal exudate or posterior oropharyngeal erythema.  Eyes:     General:        Right eye: No discharge.        Left eye: No discharge.     Extraocular Movements: Extraocular movements intact.     Conjunctiva/sclera: Conjunctivae normal.     Pupils: Pupils are equal, round, and reactive to light.  Neck:     Thyroid: No thyromegaly.     Vascular: No carotid bruit.     Trachea: No tracheal deviation.  Cardiovascular:     Rate and Rhythm: Normal rate and regular rhythm.     Pulses: Normal pulses.     Heart sounds: Normal heart sounds. No murmur heard.    No friction rub. No gallop.  Pulmonary:     Effort: Pulmonary effort is normal. No respiratory distress.     Breath sounds: Normal breath sounds. No stridor. No wheezing, rhonchi or rales.  Chest:      Chest wall: No tenderness.  Abdominal:     General: Abdomen is flat. Bowel sounds are normal. There is no distension.     Palpations: Abdomen is soft. There is no mass.     Tenderness: There is no abdominal tenderness. There is no right CVA tenderness, left CVA tenderness, guarding or rebound.     Hernia: No hernia is present.  Musculoskeletal:        General: No swelling, tenderness, deformity or signs of injury. Normal range of motion.     Cervical back: Normal range of  motion and neck supple.     Right lower leg: No edema.     Left lower leg: No edema.  Lymphadenopathy:     Cervical: No cervical adenopathy.  Skin:    General: Skin is warm and dry.     Coloration: Skin is not jaundiced or pale.     Findings: No bruising, erythema, lesion or rash.  Neurological:     General: No focal deficit present.     Mental Status: She is alert and oriented to person, place, and time.     Cranial Nerves: No cranial nerve deficit.     Sensory: No sensory deficit.     Motor: No weakness.     Coordination: Coordination normal.     Gait: Gait normal.     Deep Tendon Reflexes: Reflexes normal.  Psychiatric:        Mood and Affect: Mood normal.        Behavior: Behavior normal.        Thought Content: Thought content normal.        Judgment: Judgment normal.        Assessment & Plan:   1. Routine general medical examination at a health care facility - Continue to stay active and eat healthy  - Follow up in one year or sooner if needed - CBC with Differential/Platelet; Future - Comprehensive metabolic panel; Future - TSH; Future  2. Kidney stones - Follow up with Urology. Will check PTH today so she has that lab for when she goes  - PTH, Intact and Calcium; Future  Dorothyann Peng, NP

## 2022-11-17 ENCOUNTER — Other Ambulatory Visit: Payer: No Typology Code available for payment source

## 2022-11-17 ENCOUNTER — Encounter: Payer: No Typology Code available for payment source | Admitting: Plastic Surgery

## 2022-11-18 ENCOUNTER — Other Ambulatory Visit (INDEPENDENT_AMBULATORY_CARE_PROVIDER_SITE_OTHER): Payer: No Typology Code available for payment source

## 2022-11-18 DIAGNOSIS — Z0000 Encounter for general adult medical examination without abnormal findings: Secondary | ICD-10-CM | POA: Diagnosis not present

## 2022-11-18 DIAGNOSIS — N200 Calculus of kidney: Secondary | ICD-10-CM

## 2022-11-18 LAB — COMPREHENSIVE METABOLIC PANEL
ALT: 13 U/L (ref 0–35)
AST: 18 U/L (ref 0–37)
Albumin: 4.4 g/dL (ref 3.5–5.2)
Alkaline Phosphatase: 44 U/L (ref 39–117)
BUN: 7 mg/dL (ref 6–23)
CO2: 27 mEq/L (ref 19–32)
Calcium: 9.2 mg/dL (ref 8.4–10.5)
Chloride: 103 mEq/L (ref 96–112)
Creatinine, Ser: 0.73 mg/dL (ref 0.40–1.20)
GFR: 105.83 mL/min (ref >60.00)
Glucose, Bld: 90 mg/dL (ref 70–99)
Potassium: 3.9 mEq/L (ref 3.5–5.1)
Sodium: 137 mEq/L (ref 135–145)
Total Bilirubin: 0.7 mg/dL (ref 0.2–1.2)
Total Protein: 6.5 g/dL (ref 6.0–8.3)

## 2022-11-18 LAB — CBC WITH DIFFERENTIAL/PLATELET
Basophils Absolute: 0 10*3/uL (ref 0.0–0.1)
Basophils Relative: 0.5 % (ref 0.0–3.0)
Eosinophils Absolute: 0.2 10*3/uL (ref 0.0–0.7)
Eosinophils Relative: 3.7 % (ref 0.0–5.0)
HCT: 38.9 % (ref 36.0–46.0)
Hemoglobin: 13 g/dL (ref 12.0–15.0)
Lymphocytes Relative: 28.5 % (ref 12.0–46.0)
Lymphs Abs: 1.7 10*3/uL (ref 0.7–4.0)
MCHC: 33.5 g/dL (ref 30.0–36.0)
MCV: 88.9 fl (ref 78.0–100.0)
Monocytes Absolute: 0.5 10*3/uL (ref 0.1–1.0)
Monocytes Relative: 9.2 % (ref 3.0–12.0)
Neutro Abs: 3.4 10*3/uL (ref 1.4–7.7)
Neutrophils Relative %: 58.1 % (ref 43.0–77.0)
Platelets: 282 10*3/uL (ref 150.0–400.0)
RBC: 4.38 Mil/uL (ref 3.87–5.11)
RDW: 13.4 % (ref 11.5–15.5)
WBC: 5.9 10*3/uL (ref 4.0–10.5)

## 2022-11-18 LAB — TSH: TSH: 3.04 u[IU]/mL (ref 0.35–5.50)

## 2022-11-19 LAB — PTH, INTACT AND CALCIUM
Calcium: 9.1 mg/dL (ref 8.6–10.2)
PTH: 38 pg/mL (ref 16–77)

## 2022-11-19 LAB — EXTRA SPECIMEN

## 2022-12-02 ENCOUNTER — Other Ambulatory Visit: Payer: Self-pay | Admitting: Urology

## 2022-12-30 ENCOUNTER — Encounter (HOSPITAL_BASED_OUTPATIENT_CLINIC_OR_DEPARTMENT_OTHER): Payer: Self-pay | Admitting: Urology

## 2022-12-30 ENCOUNTER — Other Ambulatory Visit: Payer: Self-pay

## 2022-12-30 NOTE — Progress Notes (Signed)
Spoke w/ via phone for pre-op interview: patient Lab needs dos: UPT Lab results: NA COVID test: patient states asymptomatic no test needed. Arrive at 0830 01/06/23 NPO after MN except clear liquids.Clear liquids from MN until 0730. Med rec completed. Medications to take morning of surgery: none Diabetic medication: NA Patient instructed no nail polish to be worn day of surgery. Patient instructed to bring photo id and insurance card day of surgery. Patient aware to have Driver (ride ) / caregiver for 24 hours after surgery. (Husband to drive.) Patient Special Instructions: NA Pre-Op special Istructions: NA Patient verbalized understanding of instructions that were given at this phone interview. Patient denies shortness of breath, chest pain, fever, cough at this phone interview.

## 2023-01-05 NOTE — H&P (Signed)
CC/HPI: cc: gross hematuria/microscopic hematuria   10/23/20: 37 year old woman referred for microscopic hematuria. Patient has had 2 episodes of gross hematuria occurring in May 2021 that occurred after running. POCT UA done by PCP showed +blood but no microscopic count was done. Patient was experiencing some mild low back pain which is actually resolved since constipation was treated. She does have nocturia x1.   11/15/20: 37 year old woman returns for follow-up after renal bladder ultrasound. Renal bladder ultrasound shows bilateral nonobstructing calculi from 2-5 mm. Renal pyramids are echogenic with calcifications consistent with medullary sponge kidney. She is otherwise feeling well and not had any further episodes of hematuria. Urinalysis today without findings of microscopic hematuria.   01/01/2022: Back today for follow-up exam, referred by primary care. Patient had recent imaging including CT stone protocol due to complaints of having numbness and tingling in the extremities. Lumbar spine films revealed bilateral renal calculi so CT imaging was ordered for further clarification. This is showed numerous non obstructing renal calculi with largest measuring between 4 and 6 mm(greatest width) in each kidney per my independent review. No obvious obstructive signs, ureteral or bladder calculi or other concerning GU abnormality identified.   Overall doing well. She denies any unilateral lower back or flank pain/discomfort suggestive of obstructive uropathy. She denies any known stone material passage. She denies bothersome lower urinary tract symptoms including bothersome frequency/urgency, changes in force of stream, dysuria. She does mention after exercise specifically running, she will have some gross painless hematuria that clears with next void. UA without microscopic hematuria today.   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  Patient had 24-hour urine as well as CT abdomen pelvis which showed bilateral nonobstructing renal calculi and mild hypercalciuria. Patient had total urine output of 3.4 L. She has not passed any stones in the interim and denies any flank pain. She has not yet had cystoscopy.   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 She is appearing single midline low back pain that feels like it is burning as well as flank pain that moves from side-to-side. She is also been experiencing pelvic heaviness and bladder discomfort. She feels like something is not right with her body overall. She has felt like this for 6 months following the birth of her third child.     ALLERGIES: No Known Drug Allergies    MEDICATIONS: No Medications    GU PSH: No GU PSH    NON-GU PSH: Remove Gallbladder     GU PMH: Gross hematuria - 11/14/2022, - 02/05/2022, - 01/01/2022 Renal calculus - 11/14/2022, - 02/05/2022, - 01/01/2022, Discussed renal ultrasound findings. We discussed symptoms of an obstructing ureteral calculus and what to do. Will have patient return in 1 year for repeat KUB. We can also proceed with 24 hour urine if she begins to passed stones., - 2022 Low back pain, Low back pain unlikely to be from nonobstructing renal calculi - 2022 Microscopic hematuria (Stable), Discussed ultrasound findings which are consistent with bilateral stones and possible underlying medullary sponge kidney. Discussed stone prevention strategies including drinking 2-3 L of water a day and limiting animal protein and salt. - 2022, Discussed with patient obtaining upper tract imaging with renal ultrasound. UA was negative for microscopic hematuria however she has had episode of gh in past. Pending results will consider cystoscopy. This was offered to pt today however we collectively decided to postpone unless pt  develops gross hematuria again or has abnormal  finding on [place]. , - 2022    NON-GU PMH: GERD    FAMILY HISTORY: Blood In Urine - Father, Mother   SOCIAL HISTORY: Marital Status: Married Preferred Language: English; Ethnicity: Not Hispanic Or Latino; Race: White Current Smoking Status: Patient has never smoked.   Tobacco Use Assessment Completed: Used Tobacco in last 30 days? Drinks 1 drink per week.  Drinks 1 caffeinated drink per day.    REVIEW OF SYSTEMS:    GU Review Female:   Patient reports burning /pain with urination. Patient denies frequent urination, hard to postpone urination, get up at night to urinate, leakage of urine, stream starts and stops, trouble starting your stream, have to strain to urinate, and being pregnant.  Gastrointestinal (Upper):   Patient reports nausea. Patient denies vomiting and indigestion/ heartburn.  Gastrointestinal (Lower):   Patient denies diarrhea and constipation.  Constitutional:   Patient denies fever, night sweats, weight loss, and fatigue.  Skin:   Patient denies skin rash/ lesion and itching.  Eyes:   Patient denies blurred vision and double vision.  Ears/ Nose/ Throat:   Patient denies sinus problems and sore throat.  Hematologic/Lymphatic:   Patient denies swollen glands and easy bruising.  Cardiovascular:   Patient denies leg swelling and chest pains.  Respiratory:   Patient reports shortness of breath. Patient denies cough.  Endocrine:   Patient denies excessive thirst.  Musculoskeletal:   Patient reports back pain. Patient denies joint pain.  Neurological:   Patient denies headaches and dizziness.  Psychologic:   Patient denies depression and anxiety.   Notes: "sediment in urine" - cloudy with white chunks      VITAL SIGNS:      12/17/2022 10:34 AM  Weight 140 lb / 63.5 kg  Height 66 in / 167.64 cm  BP 134/86 mmHg  Pulse 43 /min  Temperature 98.5 F / 36.9 C  BMI 22.6 kg/m   GU PHYSICAL EXAMINATION:    External Genitalia: No hirsutism, no rash, no scarring, no cyst,  no erythematous lesion, no papular lesion, no blanched lesion, no warty lesion. No edema.  Urethral Meatus: Normal size. Normal position. No discharge.  Urethra: No tenderness, no mass, no scarring. No hypermobility. No leakage.  Bladder: Normal to palpation, no tenderness, no mass, normal size.  Vagina: No atrophy, no stenosis. No rectocele. No cystocele. No enterocele.   MULTI-SYSTEM PHYSICAL EXAMINATION:    Constitutional: Well-nourished. No physical deformities. Normally developed. Good grooming.  Neck: Neck symmetrical, not swollen. Normal tracheal position.  Respiratory: No labored breathing, no use of accessory muscles.   Skin: No paleness, no jaundice, no cyanosis. No lesion, no ulcer, no rash.  Neurologic / Psychiatric: Oriented to time, oriented to place, oriented to person. No depression, no anxiety, no agitation.  Eyes: Normal conjunctivae. Normal eyelids.  Ears, Nose, Mouth, and Throat: Left ear no scars, no lesions, no masses. Right ear no scars, no lesions, no masses. Nose no scars, no lesions, no masses. Normal hearing. Normal lips.  Musculoskeletal: Normal gait and station of head and neck.     Complexity of Data:  Records Review:   Previous Patient Records, POC Tool  Urine Test Review:   Urinalysis  X-Ray Review: KUB: Reviewed Films. Discussed With Patient. Bilateral renal calculi however no calculi overlying expected location of either ureter, multiple pelvic phleboliths    PROCEDURES:         Flexible Cystoscopy - 52000  Risks, benefits, and some  of the potential complications of the procedure were discussed at length with the patient including infection, bleeding, voiding discomfort, urinary retention, fever, chills, sepsis, and others. All questions were answered. Informed consent was obtained. Antibiotic prophylaxis was given. Sterile technique and intraurethral analgesia were used.  Meatus:  Normal size. Normal location. Normal condition.  Urethra:  No  hypermobility. No leakage.  Ureteral Orifices:  Normal location. Normal size. Normal shape. Effluxed clear urine.  Bladder:  No trabeculation. No tumors. Normal mucosa. No stones.      The lower urinary tract was carefully examined. The procedure was well-tolerated and without complications. Antibiotic instructions were given. Instructions were given to call the office immediately for bloody urine, difficulty urinating, urinary retention, painful or frequent urination, fever, chills, nausea, vomiting or other illness. The patient stated that she understood these instructions and would comply with them.          KUB - [id number]  A single view of the abdomen is obtained.      Patient confirmed No Neulasta OnPro Device.           Urinalysis - 81003 Dipstick Dipstick Cont'd  Color: Straw Bilirubin: Neg  Appearance: Clear Ketones: Neg  Specific Gravity: <=1.005 Blood: Neg  pH: 6.0 Protein: Neg  Glucose: Neg Urobilinogen: 0.2    Nitrites: Neg    Leukocyte Esterase: Neg    Notes:      ASSESSMENT:      ICD-10 Details  1 GU:   Renal calculus - N20.0 Chronic, Stable  2   Low back pain - M54.5 Chronic, Worsening  3   Pelvic/perineal pain - R10.2 Chronic, Worsening   PLAN:           Orders Labs CULTURE, URINE  X-Rays: KUB          Schedule Return Visit/Planned Activity: Next Available Appointment - PT Referral          Document Letter(s):  Created for Patient: Clinical Summary         Notes:   1. Pelvic pain/low back pain:  -cystoscopye normal and no clear ureteral calculus on KUB  -wonder if she is experiencing pelvic floor dysfunction --> recommend PFPT   2. Urolithiasis:  -she is scheduled for bilateral ureteroscopy/LL/stent/RPG  -we discussed risks and benefits in detail including but not limited to bleeding, infection, damage to surrounding structures, need for staged procedure, stent discomfort, ureteral stricture, pain  -keep scheduled OR date

## 2023-01-06 ENCOUNTER — Ambulatory Visit (HOSPITAL_BASED_OUTPATIENT_CLINIC_OR_DEPARTMENT_OTHER): Payer: No Typology Code available for payment source | Admitting: Anesthesiology

## 2023-01-06 ENCOUNTER — Encounter (HOSPITAL_BASED_OUTPATIENT_CLINIC_OR_DEPARTMENT_OTHER): Payer: Self-pay | Admitting: Urology

## 2023-01-06 ENCOUNTER — Encounter (HOSPITAL_BASED_OUTPATIENT_CLINIC_OR_DEPARTMENT_OTHER)
Admission: RE | Disposition: A | Discharge status: Home / Self Care | Payer: Self-pay | Source: Home / Self Care | Attending: Urology

## 2023-01-06 ENCOUNTER — Ambulatory Visit (HOSPITAL_BASED_OUTPATIENT_CLINIC_OR_DEPARTMENT_OTHER)
Admission: RE | Admit: 2023-01-06 | Discharge: 2023-01-06 | Disposition: A | Discharge status: Home / Self Care | Payer: No Typology Code available for payment source | Attending: Urology | Admitting: Urology

## 2023-01-06 ENCOUNTER — Other Ambulatory Visit: Payer: Self-pay

## 2023-01-06 DIAGNOSIS — R310 Gross hematuria: Secondary | ICD-10-CM | POA: Diagnosis not present

## 2023-01-06 DIAGNOSIS — Q615 Medullary cystic kidney: Secondary | ICD-10-CM | POA: Insufficient documentation

## 2023-01-06 DIAGNOSIS — G8929 Other chronic pain: Secondary | ICD-10-CM | POA: Diagnosis not present

## 2023-01-06 DIAGNOSIS — K5900 Constipation, unspecified: Secondary | ICD-10-CM | POA: Diagnosis not present

## 2023-01-06 DIAGNOSIS — N200 Calculus of kidney: Secondary | ICD-10-CM | POA: Insufficient documentation

## 2023-01-06 DIAGNOSIS — Z01818 Encounter for other preprocedural examination: Secondary | ICD-10-CM

## 2023-01-06 HISTORY — PX: HOLMIUM LASER APPLICATION: SHX5852

## 2023-01-06 HISTORY — PX: CYSTOSCOPY WITH RETROGRADE PYELOGRAM, URETEROSCOPY AND STENT PLACEMENT: SHX5789

## 2023-01-06 LAB — POCT PREGNANCY, URINE: Preg Test, Ur: NEGATIVE

## 2023-01-06 SURGERY — CYSTOURETEROSCOPY, WITH RETROGRADE PYELOGRAM AND STENT INSERTION
Anesthesia: General | Site: Ureter | Laterality: Bilateral

## 2023-01-06 MED ORDER — KETOROLAC TROMETHAMINE 30 MG/ML IJ SOLN
INTRAMUSCULAR | Status: AC
  Filled 2023-01-06: qty 1

## 2023-01-06 MED ORDER — FENTANYL CITRATE (PF) 100 MCG/2ML IJ SOLN
INTRAMUSCULAR | Status: AC
  Filled 2023-01-06: qty 2

## 2023-01-06 MED ORDER — AMISULPRIDE (ANTIEMETIC) 5 MG/2ML IV SOLN: 10.0000 mg | Freq: Once | INTRAVENOUS | Status: DC | PRN

## 2023-01-06 MED ORDER — CEFAZOLIN SODIUM-DEXTROSE 2-4 GM/100ML-% IV SOLN
2.0000 g | INTRAVENOUS | Status: AC
  Administered 2023-01-06: 2 g via INTRAVENOUS

## 2023-01-06 MED ORDER — DEXAMETHASONE SODIUM PHOSPHATE 4 MG/ML IJ SOLN
INTRAMUSCULAR | Status: DC | PRN
  Administered 2023-01-06: 8 mg via INTRAVENOUS

## 2023-01-06 MED ORDER — ACETAMINOPHEN 10 MG/ML IV SOLN
INTRAVENOUS | Status: DC | PRN
  Administered 2023-01-06: 1000 mg via INTRAVENOUS

## 2023-01-06 MED ORDER — ONDANSETRON HCL 4 MG/2ML IJ SOLN
INTRAMUSCULAR | Status: AC
  Filled 2023-01-06: qty 2

## 2023-01-06 MED ORDER — MIDAZOLAM HCL 2 MG/2ML IJ SOLN
INTRAMUSCULAR | Status: DC | PRN
  Administered 2023-01-06: 2 mg via INTRAVENOUS

## 2023-01-06 MED ORDER — ONDANSETRON HCL 4 MG/2ML IJ SOLN
INTRAMUSCULAR | Status: DC | PRN
  Administered 2023-01-06: 4 mg via INTRAVENOUS

## 2023-01-06 MED ORDER — HYDROMORPHONE HCL 1 MG/ML IJ SOLN
INTRAMUSCULAR | Status: AC
  Filled 2023-01-06: qty 1

## 2023-01-06 MED ORDER — FENTANYL CITRATE (PF) 100 MCG/2ML IJ SOLN
INTRAMUSCULAR | Status: DC | PRN
  Administered 2023-01-06 (×3): 50 ug via INTRAVENOUS

## 2023-01-06 MED ORDER — TAMSULOSIN HCL 0.4 MG PO CAPS: 0.4000 mg | ORAL_CAPSULE | Freq: Every day | ORAL | 0 refills | Status: DC

## 2023-01-06 MED ORDER — CEPHALEXIN 500 MG PO CAPS: 500.0000 mg | ORAL_CAPSULE | Freq: Once | ORAL | 0 refills | Status: AC

## 2023-01-06 MED ORDER — CEFAZOLIN SODIUM-DEXTROSE 2-4 GM/100ML-% IV SOLN
INTRAVENOUS | Status: AC
  Filled 2023-01-06: qty 100

## 2023-01-06 MED ORDER — PROPOFOL 10 MG/ML IV BOLUS
INTRAVENOUS | Status: DC | PRN
  Administered 2023-01-06: 200 mg via INTRAVENOUS

## 2023-01-06 MED ORDER — HYDROMORPHONE HCL 1 MG/ML IJ SOLN
0.2500 mg | INTRAMUSCULAR | Status: DC | PRN
  Administered 2023-01-06 (×2): 0.25 mg via INTRAVENOUS

## 2023-01-06 MED ORDER — SODIUM CHLORIDE 0.9 % IR SOLN
Status: DC | PRN
  Administered 2023-01-06 (×2): 3000 mL

## 2023-01-06 MED ORDER — PROMETHAZINE HCL 25 MG/ML IJ SOLN: 6.2500 mg | INTRAMUSCULAR | Status: DC | PRN

## 2023-01-06 MED ORDER — OXYCODONE HCL 5 MG/5ML PO SOLN: 5.0000 mg | Freq: Once | ORAL | Status: DC | PRN

## 2023-01-06 MED ORDER — TRAMADOL HCL 50 MG PO TABS: 50.0000 mg | ORAL_TABLET | Freq: Four times a day (QID) | ORAL | 0 refills | Status: DC | PRN

## 2023-01-06 MED ORDER — LACTATED RINGERS IV SOLN: INTRAVENOUS | Status: DC

## 2023-01-06 MED ORDER — LIDOCAINE HCL (CARDIAC) PF 100 MG/5ML IV SOSY
PREFILLED_SYRINGE | INTRAVENOUS | Status: DC | PRN
  Administered 2023-01-06: 60 mg via INTRAVENOUS

## 2023-01-06 MED ORDER — IOHEXOL 300 MG/ML  SOLN
INTRAMUSCULAR | Status: DC | PRN
  Administered 2023-01-06: 20 mL

## 2023-01-06 MED ORDER — OXYBUTYNIN CHLORIDE 5 MG PO TABS: 5.0000 mg | ORAL_TABLET | Freq: Three times a day (TID) | ORAL | 0 refills | Status: DC | PRN

## 2023-01-06 MED ORDER — DEXAMETHASONE SODIUM PHOSPHATE 10 MG/ML IJ SOLN
INTRAMUSCULAR | Status: AC
  Filled 2023-01-06: qty 1

## 2023-01-06 MED ORDER — ACETAMINOPHEN 10 MG/ML IV SOLN
INTRAVENOUS | Status: AC
  Filled 2023-01-06: qty 100

## 2023-01-06 MED ORDER — OXYCODONE HCL 5 MG PO TABS: 5.0000 mg | ORAL_TABLET | Freq: Once | ORAL | Status: DC | PRN

## 2023-01-06 MED ORDER — KETOROLAC TROMETHAMINE 30 MG/ML IJ SOLN
INTRAMUSCULAR | Status: DC | PRN
  Administered 2023-01-06: 30 mg via INTRAVENOUS

## 2023-01-06 MED ORDER — MIDAZOLAM HCL 2 MG/2ML IJ SOLN
INTRAMUSCULAR | Status: AC
  Filled 2023-01-06: qty 2

## 2023-01-06 SURGICAL SUPPLY — 23 items
BAG DRAIN URO-CYSTO SKYTR STRL (DRAIN) ×2 IMPLANT
BAG DRN UROCATH (DRAIN) ×2
BASKET ZERO TIP NITINOL 2.4FR (BASKET) IMPLANT
BSKT STON RTRVL ZERO TP 2.4FR (BASKET) ×2
CATH URETL OPEN 5X70 (CATHETERS) ×2 IMPLANT
CLOTH BEACON ORANGE TIMEOUT ST (SAFETY) ×2 IMPLANT
DRSG TEGADERM 4X4.75 (GAUZE/BANDAGES/DRESSINGS) IMPLANT
EXTRACTOR STONE 1.7FRX115CM (UROLOGICAL SUPPLIES) IMPLANT
GLOVE BIO SURGEON STRL SZ 6.5 (GLOVE) ×2 IMPLANT
GOWN STRL REUS W/TWL LRG LVL3 (GOWN DISPOSABLE) ×2 IMPLANT
GUIDEWIRE STR DUAL SENSOR (WIRE) ×2 IMPLANT
IV NS IRRIG 3000ML ARTHROMATIC (IV SOLUTION) ×2 IMPLANT
KIT TURNOVER CYSTO (KITS) ×2 IMPLANT
MANIFOLD NEPTUNE II (INSTRUMENTS) ×2 IMPLANT
PACK CYSTO (CUSTOM PROCEDURE TRAY) ×2 IMPLANT
SHEATH NAVIGATOR HD 11/13X28 (SHEATH) IMPLANT
SLEEVE SCD COMPRESS KNEE MED (STOCKING) ×2 IMPLANT
STENT URET 6FRX24 CONTOUR (STENTS) IMPLANT
STENT URET 6FRX26 CONTOUR (STENTS) IMPLANT
TRACTIP FLEXIVA PULS ID 200XHI (Laser) IMPLANT
TRACTIP FLEXIVA PULSE ID 200 (Laser) ×2
TUBE CONNECTING 12X1/4 (SUCTIONS) ×2 IMPLANT
TUBING UROLOGY SET (TUBING) ×2 IMPLANT

## 2023-01-06 NOTE — Anesthesia Procedure Notes (Signed)
Procedure Name: LMA Insertion Date/Time: 01/06/2023 10:03 AM  Performed by: Georgeanne Nim, CRNAPre-anesthesia Checklist: Patient identified, Emergency Drugs available, Suction available, Patient being monitored and Timeout performed Patient Re-evaluated:Patient Re-evaluated prior to induction Oxygen Delivery Method: Circle system utilized Preoxygenation: Pre-oxygenation with 100% oxygen Induction Type: IV induction Ventilation: Mask ventilation without difficulty LMA: LMA inserted LMA Size: 4.0 Number of attempts: 1 Placement Confirmation: positive ETCO2 and breath sounds checked- equal and bilateral Tube secured with: Tape Dental Injury: Teeth and Oropharynx as per pre-operative assessment

## 2023-01-06 NOTE — Anesthesia Preprocedure Evaluation (Signed)
Anesthesia Evaluation  Patient identified by MRN, date of birth, ID band Patient awake    Reviewed: Allergy & Precautions, H&P , NPO status , Patient's Chart, lab work & pertinent test results  Airway Mallampati: II  TM Distance: >3 FB Neck ROM: full    Dental no notable dental hx. (+) Dental Advisory Given, Teeth Intact   Pulmonary neg pulmonary ROS   Pulmonary exam normal breath sounds clear to auscultation       Cardiovascular Exercise Tolerance: Good negative cardio ROS Normal cardiovascular exam Rhythm:regular Rate:Normal     Neuro/Psych negative neurological ROS  negative psych ROS   GI/Hepatic negative GI ROS, Neg liver ROS,,,  Endo/Other  negative endocrine ROS    Renal/GU Renal disease  negative genitourinary   Musculoskeletal negative musculoskeletal ROS (+)    Abdominal  (+)  Abdomen: soft. Bowel sounds: normal.  Peds negative pediatric ROS (+)  Hematology negative hematology ROS (+)   Anesthesia Other Findings   Reproductive/Obstetrics negative OB ROS                             Anesthesia Physical Anesthesia Plan  ASA: 2  Anesthesia Plan: General   Post-op Pain Management:    Induction: Intravenous  PONV Risk Score and Plan: 3 and Ondansetron, Dexamethasone, Midazolam and Treatment may vary due to age or medical condition  Airway Management Planned: LMA  Additional Equipment:   Intra-op Plan:   Post-operative Plan: Extubation in OR  Informed Consent: I have reviewed the patients History and Physical, chart, labs and discussed the procedure including the risks, benefits and alternatives for the proposed anesthesia with the patient or authorized representative who has indicated his/her understanding and acceptance.     Dental Advisory Given  Plan Discussed with: CRNA and Surgeon  Anesthesia Plan Comments:         Anesthesia Quick Evaluation

## 2023-01-06 NOTE — Op Note (Signed)
Preoperative diagnosis: bilateral renal calculi  Postoperative diagnosis: bilateral renal calculi  Procedure:  Cystoscopy Left ureteroscopy, laser lithotripsy, basket stone extraction Right diagnostic ureteroscopy bilateral 3F x 24cm ureteral stent placement - no tether bilateral retrograde pyelography with interpretation  Surgeon: Jacalyn Lefevre, MD  Anesthesia: General  Complications: None  Intraoperative findings:  Normal urethra Bilateral orthotropic ureteral orifices retrograde pyelography demonstrated normal ureter and renal pelvis bilaterally, no filling defect noted Bladder mucosa normal without masses   EBL: Minimal  Specimens: Left renal calculi  Disposition of specimens: [facility] for stone analysis  Indication: Dana Anderson is a 37 y.o.   patient with bilateral renal stones. After reviewing the management options for treatment, the patient elected to proceed with the above surgical procedure(s). We have discussed the potential benefits and risks of the procedure, side effects of the proposed treatment, the likelihood of the patient achieving the goals of the procedure, and any potential problems that might occur during the procedure or recuperation. Informed consent has been obtained.   Description of procedure:  The patient was taken to the operating room and general anesthesia was induced.  The patient was placed in the dorsal lithotomy position, prepped and draped in the usual sterile fashion, and preoperative antibiotics were administered. A preoperative time-out was performed.   Cystourethroscopy was performed.  The patient's urethra was examined and was normal. The bladder was then systematically examined in its entirety. There was no evidence for any bladder tumors, stones, or other mucosal pathology.    Attention then turned to the left ureteral orifice and a ureteral catheter was used to intubate the ureteral orifice.   Omnipaque contrast was injected through the ureteral catheter and a retrograde pyelogram was performed with findings as dictated above.  A 0.38 sensor guidewire was then advanced up the left ureter into the renal pelvis under fluoroscopic guidance.  The open-ended ureteral catheter was removed.  A second sensor wire was advanced alongside the first sensor wire and up to the renal pelvis with fluoroscopic guidance.  The cystoscope was removed.  Next the inner ureteral access sheath was placed over one of the wires and advanced the proximal ureter with fluoroscopy.  The second wire was previously secured as a safety wire.  Next the inner sheath and outer sheath access sheath were placed together and advanced over the working wire to the proximal kidney.  The inner sheath and wire removed.  Flexible ureteroscopy then took place.  Patient had multiple adherent stones to the underlying mucosa consistent with medullary sponge kidney.  The 200 holmium laser fiber was then used to fragment the stones.  The larger stone fragments were then removed from the ureter with a 0 tip basket. reinspection of the ureter revealed no remaining visible stones or fragments.  The ureteral access sheath was removed and unison with the ureteroscope taking care to examine the ureter on the way out.  There is no trauma or or injury noted to the ureter.  The wire was then backloaded through the cystoscope and a ureteral stent was advance over the wire using Seldinger technique.  The stent was positioned appropriately under fluoroscopic and cystoscopic guidance.  The wire was then removed with an adequate stent curl noted in the renal pelvis as well as in the bladder.  Attention then turned to the right side.  An open-ended ureteral catheter was used to intubate the right ureteral orifice and a retrograde pyelogram was obtained.  No filling defects were noted.  A  sensor wire was then placed through the open-ended ureteral catheter and the  catheter was removed.  A second wire was placed alongside the first wire and advanced to the kidney with fluoroscopic guidance.  The cystoscope was removed.  1 wire was secured as a safety wire.  Next the inner sheath of the access sheath was placed over the second wire and advanced to the mid ureter at which point it met some resistance.  It was then removed.  The semirigid ureteroscope was then advanced alongside the wires up to the proximal ureter with no obstruction noted however there was tortuosity noted in the distal to mid ureter.  Again attempts at advancing the access sheath over the wire were unsuccessful.  Next, the flexible ureteroscope was attempted to be navigated into the ureter over the wire.  This was again unsuccessful.  The decision was made to leave the ureteral stent and come back in 2 weeks.  A 6 French by 26 cm double-J ureteral stent was placed in standard fashion through the cystoscope.  The proximal curl was confirmed with fluoroscopy and the distal curl seen with direct visualization.  The patient's bladder was decompressed and the procedure ended.  The patient appeared to tolerate the procedure well and without complications.  The patient was able to be awakened and transferred to the recovery unit in satisfactory condition.   Disposition: No tether's were left on the stents.  I will discuss return to the operating room in 2 weeks to address the right side.

## 2023-01-06 NOTE — Discharge Instructions (Addendum)
DISCHARGE INSTRUCTIONS FOR KIDNEY STONE/URETERAL STENT   MEDICATIONS:  1. Resume all your other meds from home  2. AZO over the counter can help with the burning/stinging when you urinate. 3. Tramadol is for moderate/severe pain, otherwise taking up to 1000 mg every 6 hours of plainTylenol will help treat your pain.   4. Oxybuytnin can help with overactive bladder 5. Tamsulosin can help with stent discomfort 6. Over  the counter: AZO, tylenol and ibuprofen may also be used in conjunction with above medications   ACTIVITY:  1. No strenuous activity x 1week  2. No driving while on narcotic pain medications  3. Drink plenty of water  4. Continue to walk at home - you can still get blood clots when you are at home, so keep active, but don't over do it.  5. May return to work/school tomorrow or when you feel ready   BATHING:  1. You can shower and we recommend daily showers    SIGNS/SYMPTOMS TO CALL:  Please call us if you have a fever greater than 101.5, uncontrolled nausea/vomiting, uncontrolled pain, dizziness, unable to urinate, bloody urine, chest pain, shortness of breath, leg swelling, leg pain, redness around wound, drainage from wound, or any other concerns or questions.   You can reach [place] at [phone].   FOLLOW-UP:  1. I discussed follow up procedure on right side vs removing stents in the office with your husband. Please call the office/portal message me or I will check on you later this week.

## 2023-01-06 NOTE — Anesthesia Postprocedure Evaluation (Signed)
Anesthesia Post Note  Patient: Dana Anderson  Procedure(s) Performed: CYSTOSCOPY WITH RETROGRADE PYELOGRAM, URETEROSCOPY AND STENT PLACEMENT ON LEFT/DIAGNOSTIC URETEROSCOPY ON RIGHT (Bilateral: Pelvis) HOLMIUM LASER APPLICATION (Bilateral: Ureter)     Patient location during evaluation: PACU Anesthesia Type: General Level of consciousness: awake and alert Pain management: pain level controlled Vital Signs Assessment: post-procedure vital signs reviewed and stable Respiratory status: spontaneous breathing, nonlabored ventilation and respiratory function stable Cardiovascular status: blood pressure returned to baseline and stable Postop Assessment: no apparent nausea or vomiting Anesthetic complications: no   No notable events documented.  Last Vitals:  Vitals:   01/06/23 1211 01/06/23 1227  BP: 112/87 117/80  Pulse:  77  Resp:  16  Temp: 36.5 C 36.4 C  SpO2:  95%    Last Pain:  Vitals:   01/06/23 1227  TempSrc:   PainSc: 3                  Lynda Rainwater

## 2023-01-06 NOTE — Transfer of Care (Signed)
Immediate Anesthesia Transfer of Care Note  Patient: Dana Anderson  Procedure(s) Performed: CYSTOSCOPY WITH RETROGRADE PYELOGRAM, URETEROSCOPY AND STENT PLACEMENT ON LEFT/DIAGNOSTIC URETEROSCOPY ON RIGHT (Bilateral: Pelvis) HOLMIUM LASER APPLICATION (Bilateral: Ureter)  Patient Location: PACU  Anesthesia Type:General  Level of Consciousness: awake, alert , oriented, and patient cooperative  Airway & Oxygen Therapy: Patient Spontanous Breathing and Patient connected to nasal cannula oxygen  Post-op Assessment: Report given to RN and Post -op Vital signs reviewed and stable  Post vital signs: Reviewed and stable  Last Vitals:  Vitals Value Taken Time  BP 125/73 01/06/23 1130  Temp    Pulse 69 01/06/23 1136  Resp 9 01/06/23 1138  SpO2 96 % 01/06/23 1136  Vitals shown include unvalidated device data.  Last Pain:  Vitals:   01/06/23 0903  TempSrc: Oral         Complications: No notable events documented.

## 2023-01-07 ENCOUNTER — Encounter (HOSPITAL_BASED_OUTPATIENT_CLINIC_OR_DEPARTMENT_OTHER): Payer: Self-pay | Admitting: Urology

## 2023-01-07 ENCOUNTER — Other Ambulatory Visit: Payer: Self-pay | Admitting: Urology

## 2023-01-12 ENCOUNTER — Other Ambulatory Visit: Payer: Self-pay

## 2023-01-12 ENCOUNTER — Encounter (HOSPITAL_BASED_OUTPATIENT_CLINIC_OR_DEPARTMENT_OTHER): Payer: Self-pay | Admitting: Urology

## 2023-01-12 NOTE — Progress Notes (Addendum)
Spoke w/ via phone for pre-op interview---Dana Anderson needs dos----    urine preg poct           Anderson results------none COVID test -----patient states asymptomatic no test needed Arrive at -------1045 am 01-20-2023 NPO after MN NO Solid Food.  Clear liquids from MN until---945 am Med rec completed Medications to take morning of surgery -----none Diabetic medication -----n/a Patient instructed no nail polish to be worn day of surgery Patient instructed to bring photo id and insurance card day of surgery Patient aware to have Driver (ride ) / caregiver  mother Dana Anderson will stay  for 24 hours after surgery  Patient Special Instructions -----none Pre-Op special Istructions -----none Patient verbalized understanding of instructions that were given at this phone interview. Patient denies shortness of breath, chest pain, fever, cough at this phone interview.  Pt to call dr pace office and clarify left stent exchange on scheduled procedure

## 2023-01-19 NOTE — Anesthesia Preprocedure Evaluation (Signed)
Anesthesia Evaluation  Patient identified by MRN, date of birth, ID band Patient awake    Reviewed: Allergy & Precautions, H&P , NPO status , Patient's Chart, lab work & pertinent test results  History of Anesthesia Complications Negative for: history of anesthetic complications  Airway Mallampati: II  TM Distance: >3 FB Neck ROM: Full    Dental no notable dental hx. (+) Dental Advisory Given   Pulmonary neg pulmonary ROS   Pulmonary exam normal        Cardiovascular Exercise Tolerance: Good negative cardio ROS Normal cardiovascular exam     Neuro/Psych negative neurological ROS  negative psych ROS   GI/Hepatic negative GI ROS, Neg liver ROS,,,  Endo/Other  negative endocrine ROS    Renal/GU Renal disease  negative genitourinary   Musculoskeletal negative musculoskeletal ROS (+)    Abdominal   Peds negative pediatric ROS (+)  Hematology negative hematology ROS (+)   Anesthesia Other Findings   Reproductive/Obstetrics negative OB ROS                             Anesthesia Physical Anesthesia Plan  ASA: 2  Anesthesia Plan: General   Post-op Pain Management: Tylenol PO (pre-op)* and Toradol IV (intra-op)*   Induction: Intravenous  PONV Risk Score and Plan: 3 and Ondansetron, Dexamethasone, Midazolam and Treatment may vary due to age or medical condition  Airway Management Planned: LMA  Additional Equipment:   Intra-op Plan:   Post-operative Plan: Extubation in OR  Informed Consent: I have reviewed the patients History and Physical, chart, labs and discussed the procedure including the risks, benefits and alternatives for the proposed anesthesia with the patient or authorized representative who has indicated his/her understanding and acceptance.     Dental advisory given  Plan Discussed with: Anesthesiologist and CRNA  Anesthesia Plan Comments:         Anesthesia  Quick Evaluation

## 2023-01-20 ENCOUNTER — Other Ambulatory Visit: Payer: Self-pay

## 2023-01-20 ENCOUNTER — Ambulatory Visit (HOSPITAL_BASED_OUTPATIENT_CLINIC_OR_DEPARTMENT_OTHER): Payer: No Typology Code available for payment source | Admitting: Anesthesiology

## 2023-01-20 ENCOUNTER — Encounter (HOSPITAL_BASED_OUTPATIENT_CLINIC_OR_DEPARTMENT_OTHER)
Admission: RE | Disposition: A | Discharge status: Home / Self Care | Payer: Self-pay | Source: Home / Self Care | Attending: Urology

## 2023-01-20 ENCOUNTER — Encounter (HOSPITAL_BASED_OUTPATIENT_CLINIC_OR_DEPARTMENT_OTHER): Payer: Self-pay | Admitting: Urology

## 2023-01-20 ENCOUNTER — Ambulatory Visit (HOSPITAL_BASED_OUTPATIENT_CLINIC_OR_DEPARTMENT_OTHER)
Admission: RE | Admit: 2023-01-20 | Discharge: 2023-01-20 | Disposition: A | Discharge status: Home / Self Care | Payer: No Typology Code available for payment source | Attending: Urology | Admitting: Urology

## 2023-01-20 DIAGNOSIS — N200 Calculus of kidney: Secondary | ICD-10-CM | POA: Insufficient documentation

## 2023-01-20 DIAGNOSIS — Z01818 Encounter for other preprocedural examination: Secondary | ICD-10-CM

## 2023-01-20 HISTORY — DX: Other specified postprocedural states: R11.2

## 2023-01-20 HISTORY — DX: Other specified postprocedural states: Z98.890

## 2023-01-20 HISTORY — PX: CYSTOSCOPY/URETEROSCOPY/HOLMIUM LASER/STENT PLACEMENT: SHX6546

## 2023-01-20 LAB — POCT PREGNANCY, URINE: Preg Test, Ur: NEGATIVE

## 2023-01-20 SURGERY — CYSTOSCOPY/URETEROSCOPY/HOLMIUM LASER/STENT PLACEMENT
Anesthesia: General | Site: Ureter | Laterality: Right

## 2023-01-20 MED ORDER — SODIUM CHLORIDE 0.9 % IR SOLN
Status: DC | PRN
  Administered 2023-01-20: 3000 mL

## 2023-01-20 MED ORDER — FENTANYL CITRATE (PF) 100 MCG/2ML IJ SOLN: 25.0000 ug | INTRAMUSCULAR | Status: DC | PRN

## 2023-01-20 MED ORDER — DIPHENHYDRAMINE HCL 50 MG/ML IJ SOLN
INTRAMUSCULAR | Status: AC
  Filled 2023-01-20: qty 1

## 2023-01-20 MED ORDER — KETOROLAC TROMETHAMINE 10 MG PO TABS: 10.0000 mg | ORAL_TABLET | Freq: Three times a day (TID) | ORAL | 0 refills | Status: DC | PRN

## 2023-01-20 MED ORDER — CEFAZOLIN SODIUM-DEXTROSE 2-4 GM/100ML-% IV SOLN
INTRAVENOUS | Status: AC
  Filled 2023-01-20: qty 100

## 2023-01-20 MED ORDER — CEFAZOLIN SODIUM-DEXTROSE 2-4 GM/100ML-% IV SOLN
2.0000 g | INTRAVENOUS | Status: AC
  Administered 2023-01-20: 2 g via INTRAVENOUS

## 2023-01-20 MED ORDER — IOHEXOL 300 MG/ML  SOLN: INTRAMUSCULAR | Status: DC | PRN

## 2023-01-20 MED ORDER — FENTANYL CITRATE (PF) 100 MCG/2ML IJ SOLN
INTRAMUSCULAR | Status: AC
  Filled 2023-01-20: qty 2

## 2023-01-20 MED ORDER — SCOPOLAMINE 1 MG/3DAYS TD PT72
1.0000 | MEDICATED_PATCH | TRANSDERMAL | Status: DC
  Administered 2023-01-20: 1.5 mg via TRANSDERMAL

## 2023-01-20 MED ORDER — MIDAZOLAM HCL 2 MG/2ML IJ SOLN
INTRAMUSCULAR | Status: DC | PRN
  Administered 2023-01-20: 2 mg via INTRAVENOUS

## 2023-01-20 MED ORDER — ACETAMINOPHEN 500 MG PO TABS
1000.0000 mg | ORAL_TABLET | Freq: Once | ORAL | Status: AC
  Administered 2023-01-20: 1000 mg via ORAL

## 2023-01-20 MED ORDER — PROMETHAZINE HCL 25 MG/ML IJ SOLN: 6.2500 mg | INTRAMUSCULAR | Status: DC | PRN

## 2023-01-20 MED ORDER — CEPHALEXIN 500 MG PO CAPS: 500.0000 mg | ORAL_CAPSULE | Freq: Once | ORAL | 0 refills | Status: AC

## 2023-01-20 MED ORDER — FENTANYL CITRATE (PF) 250 MCG/5ML IJ SOLN
INTRAMUSCULAR | Status: DC | PRN
  Administered 2023-01-20: 100 ug via INTRAVENOUS

## 2023-01-20 MED ORDER — ACETAMINOPHEN 500 MG PO TABS
ORAL_TABLET | ORAL | Status: AC
  Filled 2023-01-20: qty 2

## 2023-01-20 MED ORDER — AMISULPRIDE (ANTIEMETIC) 5 MG/2ML IV SOLN: 10.0000 mg | Freq: Once | INTRAVENOUS | Status: DC | PRN

## 2023-01-20 MED ORDER — LIDOCAINE 2% (20 MG/ML) 5 ML SYRINGE
INTRAMUSCULAR | Status: DC | PRN
  Administered 2023-01-20: 50 mg via INTRAVENOUS

## 2023-01-20 MED ORDER — LACTATED RINGERS IV SOLN: INTRAVENOUS | Status: DC

## 2023-01-20 MED ORDER — TAMSULOSIN HCL 0.4 MG PO CAPS: 0.4000 mg | ORAL_CAPSULE | Freq: Every day | ORAL | 0 refills | Status: DC

## 2023-01-20 MED ORDER — PROPOFOL 10 MG/ML IV BOLUS
INTRAVENOUS | Status: AC
  Filled 2023-01-20: qty 20

## 2023-01-20 MED ORDER — SCOPOLAMINE 1 MG/3DAYS TD PT72
MEDICATED_PATCH | TRANSDERMAL | Status: AC
  Filled 2023-01-20: qty 1

## 2023-01-20 MED ORDER — PROPOFOL 10 MG/ML IV BOLUS
INTRAVENOUS | Status: DC | PRN
  Administered 2023-01-20: 150 mg via INTRAVENOUS

## 2023-01-20 MED ORDER — DEXAMETHASONE SODIUM PHOSPHATE 10 MG/ML IJ SOLN
INTRAMUSCULAR | Status: DC | PRN
  Administered 2023-01-20: 10 mg via INTRAVENOUS

## 2023-01-20 MED ORDER — DIPHENHYDRAMINE HCL 50 MG/ML IJ SOLN
INTRAMUSCULAR | Status: DC | PRN
  Administered 2023-01-20: 12.5 mg via INTRAVENOUS

## 2023-01-20 MED ORDER — MIDAZOLAM HCL 2 MG/2ML IJ SOLN
INTRAMUSCULAR | Status: AC
  Filled 2023-01-20: qty 2

## 2023-01-20 MED ORDER — ONDANSETRON HCL 4 MG/2ML IJ SOLN
INTRAMUSCULAR | Status: DC | PRN
  Administered 2023-01-20: 4 mg via INTRAVENOUS

## 2023-01-20 SURGICAL SUPPLY — 24 items
BAG DRAIN URO-CYSTO SKYTR STRL (DRAIN) ×1 IMPLANT
BAG DRN UROCATH (DRAIN) ×1
BASKET ZERO TIP NITINOL 2.4FR (BASKET) IMPLANT
BSKT STON RTRVL ZERO TP 2.4FR (BASKET) ×1
CATH URETL OPEN 5X70 (CATHETERS) ×1 IMPLANT
CLOTH BEACON ORANGE TIMEOUT ST (SAFETY) ×1 IMPLANT
DRSG TEGADERM 4X4.75 (GAUZE/BANDAGES/DRESSINGS) IMPLANT
EXTRACTOR STONE 1.7FRX115CM (UROLOGICAL SUPPLIES) IMPLANT
GLOVE BIO SURGEON STRL SZ 6.5 (GLOVE) ×1 IMPLANT
GLOVE BIOGEL PI IND STRL 6 (GLOVE) IMPLANT
GLOVE ECLIPSE 6.0 STRL STRAW (GLOVE) IMPLANT
GOWN STRL REUS W/TWL LRG LVL3 (GOWN DISPOSABLE) ×1 IMPLANT
GUIDEWIRE STR DUAL SENSOR (WIRE) ×1 IMPLANT
IV NS IRRIG 3000ML ARTHROMATIC (IV SOLUTION) ×1 IMPLANT
KIT TURNOVER CYSTO (KITS) ×1 IMPLANT
MANIFOLD NEPTUNE II (INSTRUMENTS) ×1 IMPLANT
PACK CYSTO (CUSTOM PROCEDURE TRAY) ×1 IMPLANT
SHEATH NAVIGATOR HD 11/13X36 (SHEATH) IMPLANT
SLEEVE SCD COMPRESS KNEE MED (STOCKING) ×1 IMPLANT
STENT URET 6FRX26 CONTOUR (STENTS) IMPLANT
TRACTIP FLEXIVA PULS ID 200XHI (Laser) IMPLANT
TRACTIP FLEXIVA PULSE ID 200 (Laser) ×1
TUBE CONNECTING 12X1/4 (SUCTIONS) ×1 IMPLANT
TUBING UROLOGY SET (TUBING) ×1 IMPLANT

## 2023-01-20 NOTE — Op Note (Signed)
Preoperative diagnosis: right renal calculi  Postoperative diagnosis: right renal calculi  Procedure:  Cystoscopy right ureteroscopy, laser lithotripsy, basket stone extraction right 17F x 26 ureteral stent placement - with tether    Surgeon: Kasandra Knudsen, MD  Anesthesia: General  Complications: None  Intraoperative findings:  Normal urethra Bilateral orthotropic ureteral orifices Bladder mucosa normal without masses   EBL: Minimal  Specimens: right renal calculus  Disposition of specimens: [facility] for stone analysis  Indication: Dana Anderson is a 37 y.o.   patient with a history of medullary sponge kidney who previously underwent left ureteroscopy with bilateral ureteral stenting here for staged right-sided procedure.  I was unable to access right kidney at time of last procedure.  In the interim she has had the left stent removed.  After reviewing the management options for treatment, the patient elected to proceed with the above surgical procedure(s). We have discussed the potential benefits and risks of the procedure, side effects of the proposed treatment, the likelihood of the patient achieving the goals of the procedure, and any potential problems that might occur during the procedure or recuperation. Informed consent has been obtained.   Description of procedure:  The patient was taken to the operating room and general anesthesia was induced.  The patient was placed in the dorsal lithotomy position, prepped and draped in the usual sterile fashion, and preoperative antibiotics were administered. A preoperative time-out was performed.   Cystourethroscopy was performed.  The patient's urethra was examined and was normal.  The bladder was then systematically examined in its entirety. There was no evidence for any bladder tumors, stones, or other mucosal pathology.    Attention then turned to the right ureteral orifice and the existing  ureteral stent was grabbed with a graspers and brought to the urethral meatus.  A 0.38 sensor wire was then advanced through the ureteral stent and into the kidney with fluoroscopic guidance.  The stent was examined, deemed to be intact and then discarded.  A second wire was placed alongside the first wire and advanced to the kidney with fluoroscopy.  Next a ureteral access sheath was placed over one of the wires and advanced to the proximal ureter with fluoroscopic guidance.  The inner sheath and wire removed.  Flexible ureteroscopy then took place.   The renal calculi encountered within lasered with a 242 m holmium laser fiber.  The larger stone fragments were removed with a 0 tip basket.  Again many of the stones were adherent to the underlying parenchyma consistent with medullary sponge kidney.  Reinspection of the ureter revealed no remaining visible stones or large fragments.   The wire was then backloaded through the cystoscope and a ureteral stent was advance over the wire using Seldinger technique.  The stent was positioned appropriately under fluoroscopic and cystoscopic guidance.  The wire was then removed with an adequate stent curl noted in the renal pelvis as well as in the bladder.  The bladder was then emptied and the procedure ended.  The patient appeared to tolerate the procedure well and without complications.  The patient was able to be awakened and transferred to the recovery unit in satisfactory condition.   Disposition: The tether of the stent was left on and tucked inside the patient's vagina.  Instructions for removing the stent have been provided to the patient.

## 2023-01-20 NOTE — H&P (Signed)
CC/HPI: cc: gross hematuria/microscopic hematuria   10/23/20: 37 year old woman referred for microscopic hematuria. Patient has had 2 episodes of gross hematuria occurring in May 2021 that occurred after running. POCT UA done by PCP showed +blood but no microscopic count was done. Patient was experiencing some mild low back pain which is actually resolved since constipation was treated. She does have nocturia x1.   11/15/20: 37 year old woman returns for follow-up after renal bladder ultrasound. Renal bladder ultrasound shows bilateral nonobstructing calculi from 2-5 mm. Renal pyramids are echogenic with calcifications consistent with medullary sponge kidney. She is otherwise feeling well and not had any further episodes of hematuria. Urinalysis today without findings of microscopic hematuria.   01/01/2022: Back today for follow-up exam, referred by primary care. Patient had recent imaging including CT stone protocol due to complaints of having numbness and tingling in the extremities. Lumbar spine films revealed bilateral renal calculi so CT imaging was ordered for further clarification. This is showed numerous non obstructing renal calculi with largest measuring between 4 and 6 mm(greatest width) in each kidney per my independent review. No obvious obstructive signs, ureteral or bladder calculi or other concerning GU abnormality identified.   Overall doing well. She denies any unilateral lower back or flank pain/discomfort suggestive of obstructive uropathy. She denies any known stone material passage. She denies bothersome lower urinary tract symptoms including bothersome frequency/urgency, changes in force of stream, dysuria. She does mention after exercise specifically running, she will have some gross painless hematuria that clears with next void. UA without microscopic hematuria today.   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  Patient had 24-hour urine as well as CT abdomen pelvis which showed bilateral nonobstructing renal calculi and mild hypercalciuria. Patient had total urine output of 3.4 L. She has not passed any stones in the interim and denies any flank pain. She has not yet had cystoscopy.   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 She is appearing single midline low back pain that feels like it is burning as well as flank pain that moves from side-to-side. She is also been experiencing pelvic heaviness and bladder discomfort. She feels like something is not right with her body overall. She has felt like this for 6 months following the birth of her third child.   01/15/2023: 37 year old female who presents today for left-sided stent removal. She has bilateral stents at this time scheduled to undergo a right-sided ureteroscopy in 2 weeks. She reports that she has significant pain and discomfort in the evenings and finds the Flomax and oxybutynin to be more beneficial than anything else.She deines fevers and chills. She is having gross hematuria.     ALLERGIES: No Known Drug Allergies    MEDICATIONS: Ondansetron Hcl 4 mg tablet 1 tablet PO Q 8 H PRN nausea     GU PSH: Cystoscopy - 12/17/2022 Cystoscopy Insert Stent, Right - 01/06/2023 Cystoscopy Ureteroscopy, Right - 01/06/2023 Ureteroscopic laser litho, Left - 01/06/2023     NON-GU PSH: Remove Gallbladder     GU PMH: Low back pain - 12/17/2022, Low back pain unlikely to be from nonobstructing renal calculi, - 2022 Pelvic/perineal pain - 12/17/2022 Renal calculus - 12/17/2022, - 11/14/2022, - 02/05/2022, - 01/01/2022, Discussed renal ultrasound findings. We discussed symptoms of an obstructing ureteral calculus and what to do. Will have patient return in 1 year for repeat KUB. We can also proceed with 24  hour urine if she begins to passed stones., - 2022 Gross hematuria - 11/14/2022,  - 02/05/2022, - 01/01/2022 Microscopic hematuria (Stable), Discussed ultrasound findings which are consistent with bilateral stones and possible underlying medullary sponge kidney. Discussed stone prevention strategies including drinking 2-3 L of water a day and limiting animal protein and salt. - 2022, Discussed with patient obtaining upper tract imaging with renal ultrasound. UA was negative for microscopic hematuria however she has had episode of gh in past. Pending results will consider cystoscopy. This was offered to pt today however we collectively decided to postpone unless pt develops gross hematuria again or has abnormal finding on [place]. , - 2022    NON-GU PMH: GERD    FAMILY HISTORY: Blood In Urine - Father, Mother   SOCIAL HISTORY: Marital Status: Married Preferred Language: English; Ethnicity: Not Hispanic Or Latino; Race: White Current Smoking Status: Patient has never smoked.   Tobacco Use Assessment Completed: Used Tobacco in last 30 days? Drinks 1 drink per week.  Drinks 1 caffeinated drink per day.    REVIEW OF SYSTEMS:    GU Review Female:   Patient reports frequent urination, hard to postpone urination, and burning /pain with urination. Patient denies get up at night to urinate, leakage of urine, stream starts and stops, trouble starting your stream, have to strain to urinate, and being pregnant.  Gastrointestinal (Upper):   Patient denies nausea, vomiting, and indigestion/ heartburn.  Gastrointestinal (Lower):   Patient denies diarrhea and constipation.  Constitutional:   Patient denies fever, night sweats, weight loss, and fatigue.  Skin:   Patient denies skin rash/ lesion and itching.  Hematologic/Lymphatic:   Patient denies swollen glands and easy bruising.  Cardiovascular:   Patient denies chest pains and leg swelling.  Respiratory:   Patient denies cough and shortness of breath.  Musculoskeletal:   Patient denies back pain and joint pain.  Neurological:   Patient  denies headaches and dizziness.  Psychologic:   Patient denies depression and anxiety.   VITAL SIGNS:      01/15/2023 10:02 AM  BP 112/81 mmHg  Heart Rate 80 /min  Temperature 99.3 F / 37.3 C   MULTI-SYSTEM PHYSICAL EXAMINATION:    Constitutional: Well-nourished. No physical deformities. Normally developed. Good grooming.  Respiratory: No labored breathing, no use of accessory muscles.   Cardiovascular: Normal temperature, normal extremity pulses, no swelling, no varicosities.  Skin: No paleness, no jaundice, no cyanosis. No lesion, no ulcer, no rash.  Neurologic / Psychiatric: Oriented to time, oriented to place, oriented to person. No depression, no anxiety, no agitation.  Gastrointestinal: No mass, no tenderness, no rigidity, non obese abdomen.     PAST DATA REVIEW: None   PROCEDURES:         Flexible Cystoscopy Left Stent Removal - 78469  Risks, benefits, and some of the potential complications of the procedure were discussed at length with the patient including infection, bleeding, voiding discomfort, urinary retention, fever, chills, sepsis, and others. All questions were answered. Informed consent was obtained. Antibiotic prophylaxis was given. Sterile technique and intraurethral analgesia were used.  Meatus:  Normal size. Normal location. Normal condition.  Urethra:  No hypermobility. No leakage.  Ureteral Orifices:  Normal location. Normal size. Normal shape. Effluxed clear urine.  Bladder:  No trabeculation. No tumors. Normal mucosa. No stones.  The left ureteral stent was carefully removed with a grasping instrument.    The lower urinary tract was carefully examined. The procedure was well-tolerated and without complications. Antibiotic instructions  were given. Instructions were given to call the office immediately for bloody urine, difficulty urinating, urinary retention, painful or frequent urination, fever, chills, nausea, vomiting or other illness. The patient stated that  she understood these instructions and would comply with them.         Urinalysis w/Scope Dipstick Dipstick Cont'd Micro  Color: Red Bilirubin: Neg mg/dL WBC/hpf: 0 - 5/hpf  Appearance: Slightly Cloudy Ketones: Neg mg/dL RBC/hpf: 3 - 04/VWU  Specific Gravity: <=1.005 Blood: 3+ ery/uL Bacteria: Rare (0-9/hpf)  pH: 6.0 Protein: 1+ mg/dL Cystals: NS (Not Seen)  Glucose: Neg mg/dL Urobilinogen: 0.2 mg/dL Casts: NS (Not Seen)    Nitrites: Neg Trichomonas: Not Present    Leukocyte Esterase: 1+ leu/uL Mucous: Not Present      Epithelial Cells: 0 - 5/hpf      Yeast: NS (Not Seen)      Sperm: Not Present         Ketoralac  - [id number], [id number] The area was prepped and cleaned using sterile technique. A band aid was applied. The pt tolerated well.  zero wasted   Qty: 60 Adm. By: Barbaraann Rondo  Unit: mg Lot No [id number]  Route: IM Exp. Date 05/31/2023  Freq: None Mfgr.:   Site: Left Buttock   ASSESSMENT:      ICD-10 Details  1 GU:   Renal calculus - N20.0 Bilateral, Chronic, Stable   PLAN:            Medications New Meds: Phenazopyridine Hcl 200 mg tablet 1 tablet PO TID PRN for bladder pain  #20  0 Refill(s)  Hyoscyamine Sulfate 0.125 mg tablet, sublingual 1 tablet PO QID PRN for bladder spams  #30  1 Refill(s)  Pharmacy Name:  [id number]  Address:  [address]   [place], [place] [address]  Phone:  [phone]  Fax:  [phone]            Orders Labs CULTURE, URINE          Schedule Procedure: 01/15/2023 at [facility] - [phone] - Ketoralac  (Toradol per  (60 mg)) - [id number], [id number]          Document Letter(s):  Created for Patient: Clinical Summary         Notes:   Left ureteral stent was carefully removed. She was given a 60 mg injection of ketorolac. For spasms and pain I advised the use of hyoscyamine and phenazopyridine. Keep her upcoming surgical appointment as scheduled on 01/20/2023.

## 2023-01-20 NOTE — Anesthesia Procedure Notes (Signed)
Procedure Name: LMA Insertion Date/Time: 01/20/2023 12:41 PM  Performed by: Bishop Limbo, CRNAPre-anesthesia Checklist: Patient identified, Emergency Drugs available, Suction available and Patient being monitored Patient Re-evaluated:Patient Re-evaluated prior to induction Oxygen Delivery Method: Circle System Utilized Preoxygenation: Pre-oxygenation with 100% oxygen Induction Type: IV induction Ventilation: Mask ventilation without difficulty LMA: LMA inserted LMA Size: 4.0 Number of attempts: 1 Placement Confirmation: positive ETCO2 Tube secured with: Tape Dental Injury: Teeth and Oropharynx as per pre-operative assessment

## 2023-01-20 NOTE — Interval H&P Note (Signed)
History and Physical Interval Note: Patient had left ureteral stent removed in the office last week.  She returns for staged procedure on right side.  01/20/2023 11:46 AM  Dana Anderson  has presented today for surgery, with the diagnosis of RENAL CALCULI.  The various methods of treatment have been discussed with the patient and family. After consideration of risks, benefits and other options for treatment, the patient has consented to  Procedure(s): CYSTOSCOPY, RIGHT URETEROSCOPY, HOLMIUM LASER, LITHOTRIPSY, RIGHT URETERAL STENT EXCHANGED (Right) as a surgical intervention.  The patient's history has been reviewed, patient examined, no change in status, stable for surgery.  I have reviewed the patient's chart and labs.  Questions were answered to the patient's satisfaction.     Dana Anderson

## 2023-01-20 NOTE — Anesthesia Postprocedure Evaluation (Signed)
Anesthesia Post Note  Patient: Dana Anderson  Procedure(s) Performed: CYSTOSCOPY, RIGHT URETEROSCOPY, HOLMIUM LASER, LITHOTRIPSY, RIGHT URETERAL STENT EXCHANGED (Right: Ureter)     Patient location during evaluation: PACU Anesthesia Type: General Level of consciousness: sedated Pain management: pain level controlled Vital Signs Assessment: post-procedure vital signs reviewed and stable Respiratory status: spontaneous breathing and respiratory function stable Cardiovascular status: stable Postop Assessment: no apparent nausea or vomiting Anesthetic complications: no   No notable events documented.  Last Vitals:  Vitals:   01/20/23 1345 01/20/23 1409  BP: 120/80 116/81  Pulse: 63 62  Resp: 13 14  Temp:  36.9 C  SpO2: 98% 100%    Last Pain:  Vitals:   01/20/23 1409  TempSrc:   PainSc: 3                  Cherity Blickenstaff DANIEL

## 2023-01-20 NOTE — Discharge Instructions (Addendum)
DISCHARGE INSTRUCTIONS FOR KIDNEY STONE/URETERAL STENT   MEDICATIONS:  1. Resume all your other meds from home  2. AZO over the counter can help with the burning/stinging when you urinate. 3. Toradol (ketorolac) is for moderate/severe pain and can be taken with tylenol and tramadol 4. Take Cephalexin one hour prior to removal of your stent.  5. Tamsulosin can help with stent discomfort   ACTIVITY:  1. No strenuous activity x 1week  2. No driving while on narcotic pain medications  3. Drink plenty of water  4. Continue to walk at home - you can still get blood clots when you are at home, so keep active, but don't over do it.  5. May return to work/school tomorrow or when you feel ready   BATHING:  1. You can shower and we recommend daily showers  2. You have a string coming from your urethra: The stent string is attached to your ureteral stent. Do not pull on this.   SIGNS/SYMPTOMS TO CALL:  Please call us if you have a fever greater than 101.5, uncontrolled nausea/vomiting, uncontrolled pain, dizziness, unable to urinate, bloody urine, chest pain, shortness of breath, leg swelling, leg pain, redness around wound, drainage from wound, or any other concerns or questions.   You can reach [place] at [phone].   FOLLOW-UP:  1. You have a string attached to your stent, you may remove it on Friday, April 19. To do this, pull the stringsuntil the stent is completely removed. You may feel an odd sensation in your back.   [facility] [phone] Post Ureteroscopy With or Without Stent Instructions  Definitions:  Ureter: The duct that transports urine from the kidney to the bladder. Stent:   A plastic hollow tube that is placed into the ureter, from the kidney to the bladder to prevent the ureter from swelling shut.  GENERAL INSTRUCTIONS:  Despite the fact that no skin incisions were used, the area around the ureter and bladder is raw and irritated. The stent is a  foreign body which will further irritate the bladder wall. This irritation is manifested by increased frequency of urination, both day and night, and by an increase in the urge to urinate. In some, the urge to urinate is present almost always. Sometimes the urge is strong enough that you may not be able to stop yourself from urinating. The only real cure is to remove the stent and then give time for the bladder wall to heal which can't be done until the danger of the ureter swelling shut has passed, which varies.  You may see some blood in your urine while the stent is in place and a few days afterwards. Do not be alarmed, even if the urine was clear for a while. Get off your feet and drink lots of fluids until clearing occurs. If you start to pass clots or don't improve, call us.  DIET: You may return to your normal diet immediately. Because of the raw surface of your bladder, alcohol, spicy foods, acid type foods and drinks with caffeine may cause irritation or frequency and should be used in moderation. To keep your urine flowing freely and to avoid constipation, drink plenty of fluids during the day ( 8-10 glasses ). Tip: Avoid cranberry juice because it is very acidic.  ACTIVITY: Your physical activity doesn't need to be restricted. However, if you are very active, you may see some blood in your urine. We suggest that you reduce your activity under these circumstances until  the bleeding has stopped.  BOWELS: It is important to keep your bowels regular during the postoperative period. Straining with bowel movements can cause bleeding. A bowel movement every other day is reasonable. Use a mild laxative if needed, such as Milk of Magnesia 2-3 tablespoons, or 2 Dulcolax tablets. Call if you continue to have problems. If you have been taking narcotics for pain, before, during or after your surgery, you may be constipated. Take a laxative if necessary.   MEDICATION: You should resume your pre-surgery  medications unless told not to. In addition you will often be given an antibiotic to prevent infection. These should be taken as prescribed until the bottles are finished unless you are having an unusual reaction to one of the drugs.  PROBLEMS YOU SHOULD REPORT TO [place]: Fevers over 100.5 Fahrenheit. Heavy bleeding, or clots ( See above notes about blood in urine ). Inability to urinate. Drug reactions ( hives, rash, nausea, vomiting, diarrhea ). Severe burning or pain with urination that is not improving.  FOLLOW-UP: You will need a follow-up appointment to monitor your progress. Call for this appointment at the number listed above. Usually the first appointment will be about three to fourteen days after your surgery.  May take Tylenol as needed for discomfort/soreness beginning at 5 PM today.  Post Anesthesia Home Care Instructions  Activity: Get plenty of rest for the remainder of the day. A responsible individual must stay with you for 24 hours following the procedure.  For the next 24 hours, DO NOT: -Drive a car -Advertising copywriter -Drink alcoholic beverages -Take any medication unless instructed by your physician -Make any legal decisions or sign important papers.  Meals: Start with liquid foods such as gelatin or soup. Progress to regular foods as tolerated. Avoid greasy, spicy, heavy foods. If nausea and/or vomiting occur, drink only clear liquids until the nausea and/or vomiting subsides. Call your physician if vomiting continues.  Special Instructions/Symptoms: Your throat may feel dry or sore from the anesthesia or the breathing tube placed in your throat during surgery. If this causes discomfort, gargle with warm salt water. The discomfort should disappear within 24 hours.  If you had a scopolamine patch placed behind your ear for the management of post- operative nausea and/or vomiting:  1. The medication in the patch is effective for 72 hours, after which it should be  removed.  Wrap patch in a tissue and discard in the trash. Wash hands thoroughly with soap and water. 2. You may remove the patch earlier than 72 hours if you experience unpleasant side effects which may include dry mouth, dizziness or visual disturbances. 3. Avoid touching the patch. Wash your hands with soap and water after contact with the patch.    Remove patch behind left ear by Friday, January 23, 2023.

## 2023-01-20 NOTE — Transfer of Care (Signed)
Immediate Anesthesia Transfer of Care Note  Patient: Dana Anderson  Procedure(s) Performed: CYSTOSCOPY, RIGHT URETEROSCOPY, HOLMIUM LASER, LITHOTRIPSY, RIGHT URETERAL STENT EXCHANGED (Right: Ureter)  Patient Location: PACU  Anesthesia Type:General  Level of Consciousness: awake, alert , and oriented  Airway & Oxygen Therapy: Patient Spontanous Breathing  Post-op Assessment: Report given to RN and Post -op Vital signs reviewed and stable  Post vital signs: Reviewed and stable  Last Vitals:  Vitals Value Taken Time  BP 121/81 01/20/23 1323  Temp    Pulse 73 01/20/23 1324  Resp 10 01/20/23 1324  SpO2 100 % 01/20/23 1324  Vitals shown include unvalidated device data.  Last Pain:  Vitals:   01/20/23 1112  TempSrc: Oral  PainSc: 0-No pain      Patients Stated Pain Goal: 5 (01/20/23 1112)  Complications: No notable events documented.

## 2023-01-21 ENCOUNTER — Encounter (HOSPITAL_BASED_OUTPATIENT_CLINIC_OR_DEPARTMENT_OTHER): Payer: Self-pay | Admitting: Urology

## 2023-04-18 IMAGING — CT CT RENAL STONE PROTOCOL
1 of 2 series · 15 of 32 positions shown, 19 images · non-contrast
Comparison: None.

CLINICAL DATA: Hematuria, flank pain



[Series 2: renal stone 5.0 · axial · 0.84mm/px · z∈[-495,-50]mm · 15 of 97 slices shown, 19 images]
[im 4/97  soft-tissue]
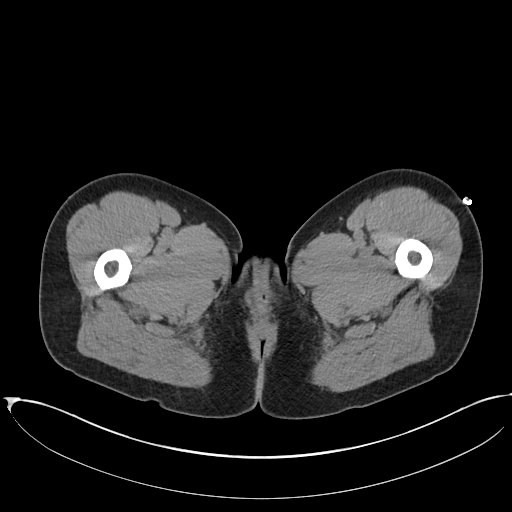
[im 4/97  bone]
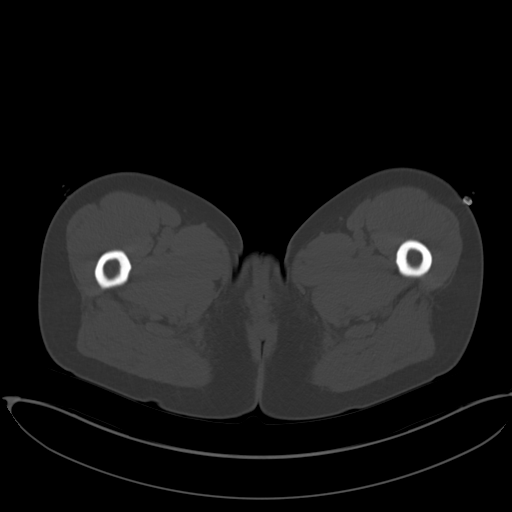
[im 12/97  soft-tissue]
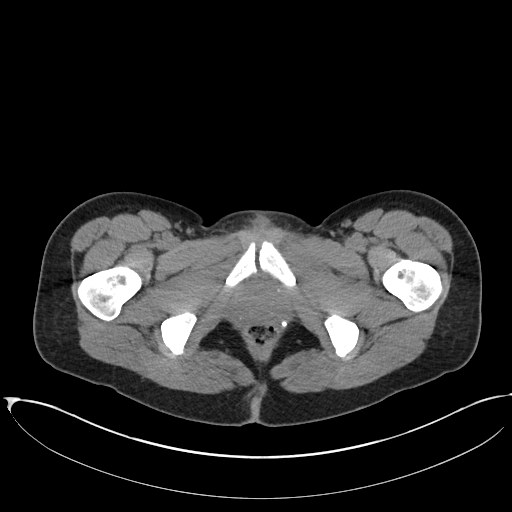
[im 20/97  soft-tissue]
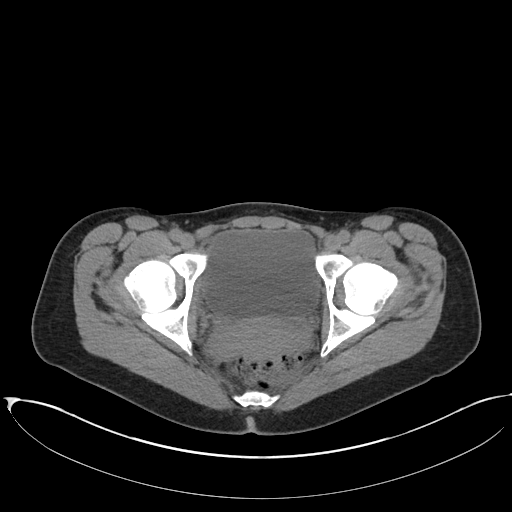
[im 27/97  soft-tissue]
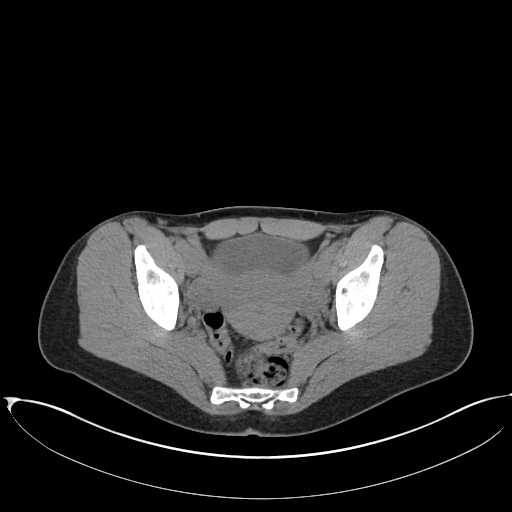
[im 35/97  soft-tissue]
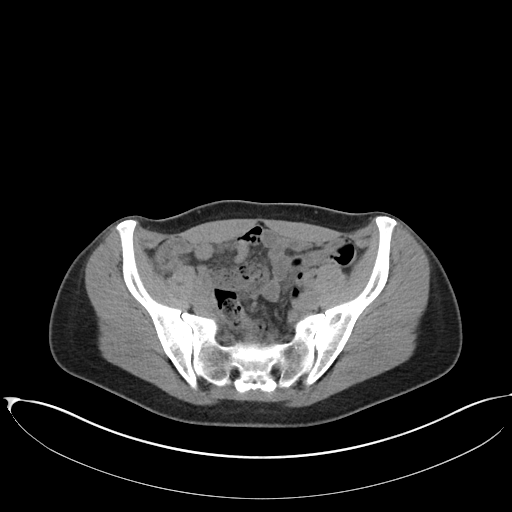
[im 43/97  soft-tissue]
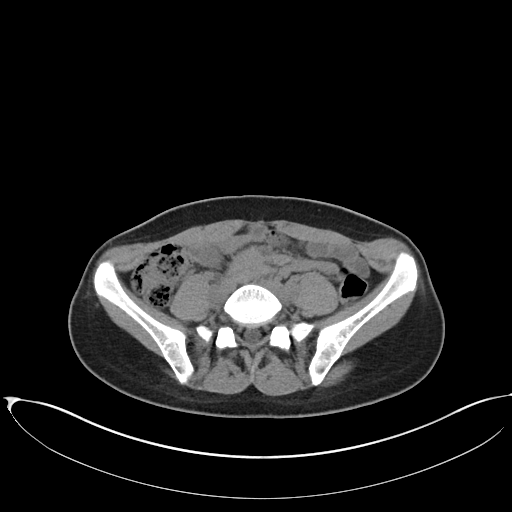
[im 50/97  soft-tissue]
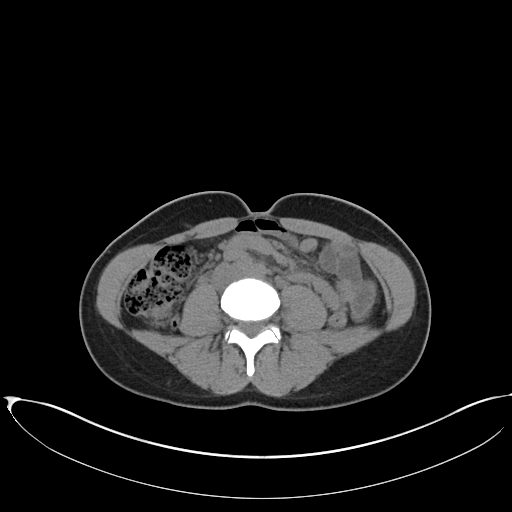
[im 54/97  soft-tissue]
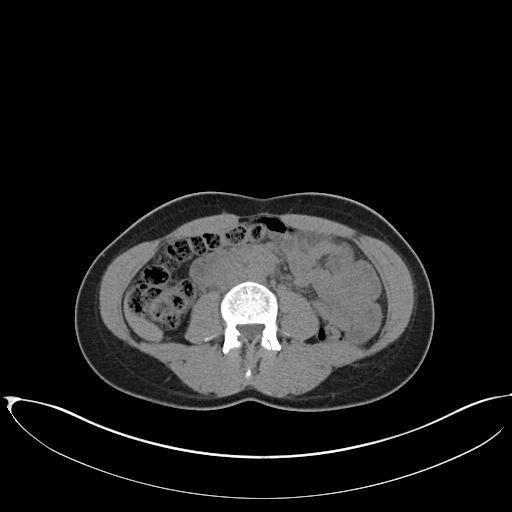
[im 62/97  soft-tissue]
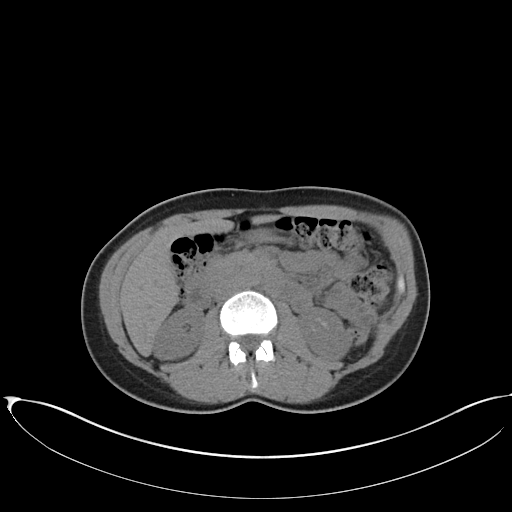
[im 62/97  bone]
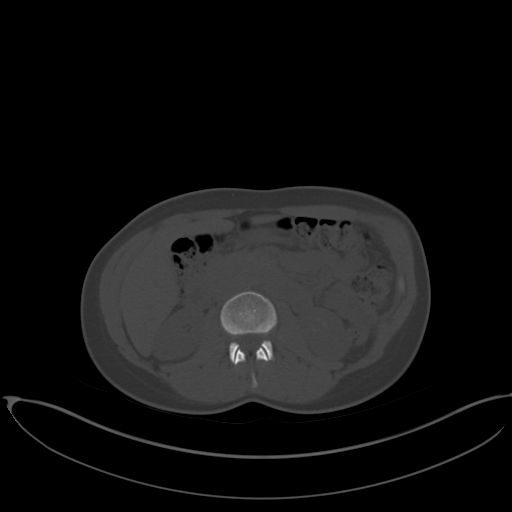
[im 70/97  soft-tissue]
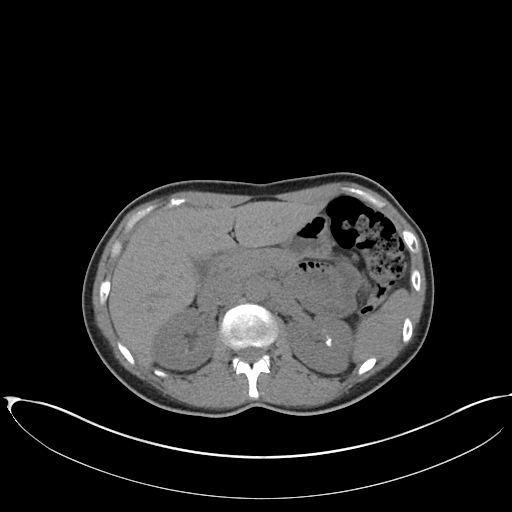
[im 77/97  soft-tissue]
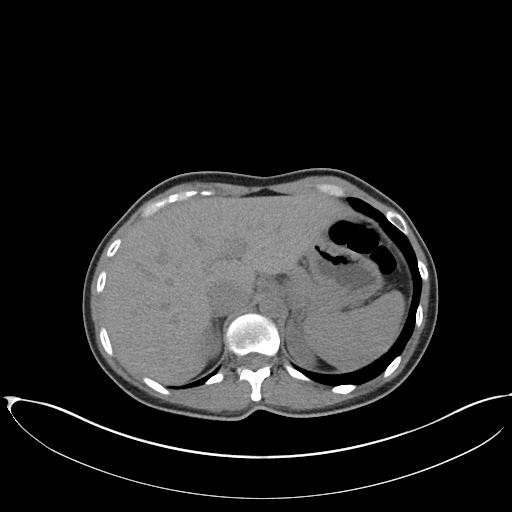
[im 81/97  lung]
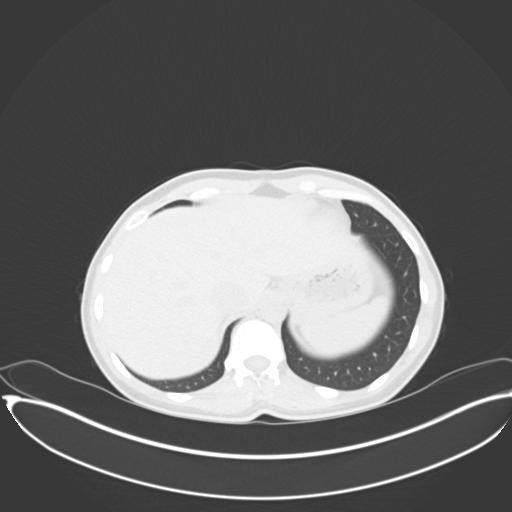
[im 85/97  soft-tissue]
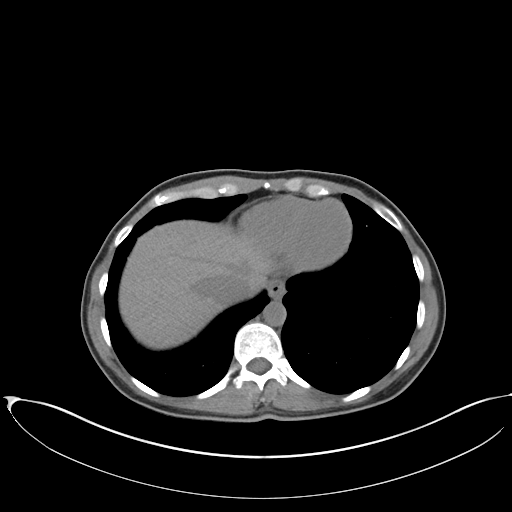
[im 85/97  lung]
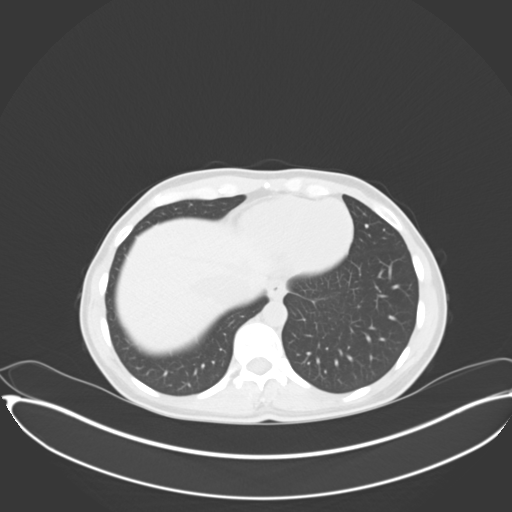
[im 89/97  lung]
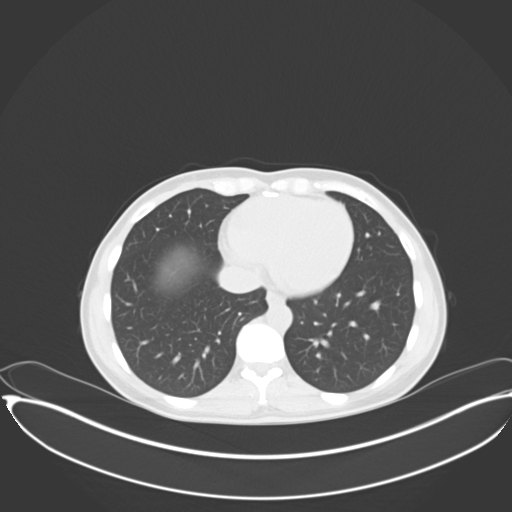
[im 93/97  soft-tissue]
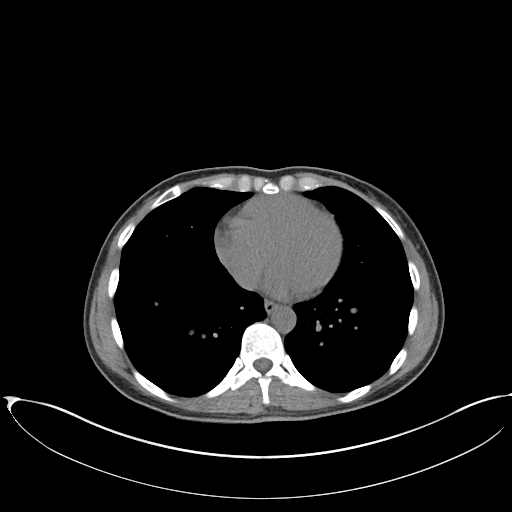
[im 93/97  lung]
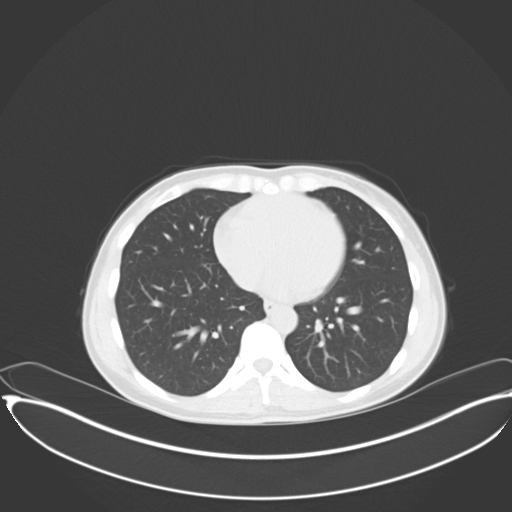

[15 of 32 positions shown; findings below may reference images not displayed]

FINDINGS: Lower chest: No acute abnormality.

Hepatobiliary: No focal liver abnormality is seen. Status post
cholecystectomy. No biliary dilatation.

Pancreas: Unremarkable

Spleen: Unremarkable

Adrenals/Urinary Tract: The adrenal glands are unremarkable. The
kidneys are normal in size and position. Numerous nonobstructing
renal calculi are identified within the kidneys bilaterally
measuring up to 8-9 mm. No ureteral calculi. No hydronephrosis. No
perinephric inflammatory stranding or fluid collections are
identified. The bladder is unremarkable.

Stomach/Bowel: Stomach is within normal limits. Appendix appears
normal. No evidence of bowel wall thickening, distention, or
inflammatory changes.

Vascular/Lymphatic: No significant vascular findings are present. No
enlarged abdominal or pelvic lymph nodes.

Reproductive: Uterus and bilateral adnexa are unremarkable.

Other: No abdominal wall hernia.  Rectum unremarkable.

Musculoskeletal: No acute bone abnormality. No lytic or blastic bone
lesion.
IMPRESSION: Moderate bilateral nonobstructing nephrolithiasis. No urolithiasis.
No hydronephrosis.

## 2023-07-02 ENCOUNTER — Ambulatory Visit: Payer: No Typology Code available for payment source | Admitting: Family Medicine

## 2023-07-02 ENCOUNTER — Encounter: Payer: Self-pay | Admitting: Family Medicine

## 2023-07-02 VITALS — BP 104/70 | HR 64 | Temp 98.2°F | Ht 66.0 in | Wt 143.0 lb

## 2023-07-02 DIAGNOSIS — E559 Vitamin D deficiency, unspecified: Secondary | ICD-10-CM | POA: Insufficient documentation

## 2023-07-02 DIAGNOSIS — H66001 Acute suppurative otitis media without spontaneous rupture of ear drum, right ear: Secondary | ICD-10-CM | POA: Diagnosis not present

## 2023-07-02 DIAGNOSIS — J069 Acute upper respiratory infection, unspecified: Secondary | ICD-10-CM

## 2023-07-02 MED ORDER — AMOXICILLIN-POT CLAVULANATE 500-125 MG PO TABS
1.0000 | ORAL_TABLET | Freq: Two times a day (BID) | ORAL | 0 refills | Status: AC
Start: 2023-07-02 — End: 2023-07-09

## 2023-07-02 NOTE — Progress Notes (Signed)
Established Patient Office Visit   Subjective  Patient ID: Dana Anderson, female    DOB: Jul 14, 1986  Age: 37 y.o. MRN: [id number]  Chief Complaint  Patient presents with   Ear Pain    Right ear     Patient is a 37 year old female followed by client after being seen for acute concern.  Patient endorses intermittent right ear discomfort, muffled sound, reading for several days or more.  Developed nasal congestion and mild sore throat at 1 day ago.  Patient tried Tylenol for ear pain.  Wanted to have of symptoms checked before going out of town this weekend.      ROS Negative unless stated above    Objective:     BP 104/70 (BP Location: Left Arm, Patient Position: Sitting, Cuff Size: Normal)   Pulse 64   Temp 98.2 F (36.8 C) (Oral)   Ht 5\' 6"  (1.676 m)   Wt 143 lb (64.9 kg)   LMP  (LMP Unknown)   SpO2 99%   BMI 23.08 kg/m    Physical Exam Constitutional:      General: She is not in acute distress.    Appearance: Normal appearance.  HENT:     Head: Normocephalic and atraumatic.     Right Ear: Hearing normal. Tympanic membrane is erythematous.     Left Ear: Hearing and tympanic membrane normal.     Ears:     Comments: Right TM erythematous, full with suppurative fluid    Nose: Nose normal.     Mouth/Throat:     Mouth: Mucous membranes are moist.  Cardiovascular:     Rate and Rhythm: Normal rate and regular rhythm.     Heart sounds: Normal heart sounds. No murmur heard.    No gallop.  Pulmonary:     Effort: Pulmonary effort is normal. No respiratory distress.     Breath sounds: Normal breath sounds. No wheezing, rhonchi or rales.  Skin:    General: Skin is warm and dry.  Neurological:     Mental Status: She is alert and oriented to person, place, and time.      No results found for any visits on 07/02/23.    Assessment & Plan:  Acute suppurative otitis media of right ear without spontaneous rupture of tympanic membrane, recurrence not  specified -     Amoxicillin-Pot Clavulanate; Take 1 tablet by mouth in the morning and at bedtime for 7 days.  Dispense: 14 tablet; Refill: 0  Acute URI  Start ABX for right AOM.  URI symptoms x 1 day likely viral.  Supportive care including OTC cold medications, gargling with warm salt water or Chloraseptic spray, Tylenol or NSAIDs as needed for pain/discomfort.  Consider Flonase nasal spray if needed.  Return if symptoms worsen or fail to improve.   Deeann Saint, MD

## 2023-07-03 ENCOUNTER — Ambulatory Visit: Payer: No Typology Code available for payment source | Admitting: Adult Health

## 2023-08-25 ENCOUNTER — Other Ambulatory Visit: Payer: Self-pay | Admitting: Obstetrics and Gynecology

## 2023-08-25 DIAGNOSIS — N6310 Unspecified lump in the right breast, unspecified quadrant: Secondary | ICD-10-CM

## 2023-11-18 ENCOUNTER — Encounter: Payer: Self-pay | Admitting: Adult Health

## 2023-11-18 ENCOUNTER — Ambulatory Visit (INDEPENDENT_AMBULATORY_CARE_PROVIDER_SITE_OTHER): Payer: No Typology Code available for payment source | Admitting: Adult Health

## 2023-11-18 VITALS — BP 110/70 | HR 77 | Temp 98.1°F | Ht 66.75 in | Wt 148.0 lb

## 2023-11-18 DIAGNOSIS — Z0000 Encounter for general adult medical examination without abnormal findings: Secondary | ICD-10-CM

## 2023-11-18 DIAGNOSIS — E559 Vitamin D deficiency, unspecified: Secondary | ICD-10-CM | POA: Diagnosis not present

## 2023-11-18 DIAGNOSIS — N200 Calculus of kidney: Secondary | ICD-10-CM | POA: Diagnosis not present

## 2023-11-18 NOTE — Progress Notes (Signed)
Subjective:    Patient ID: Dana Anderson, female    DOB: 18-Apr-1986, 38 y.o.   MRN: [id number]  HPI Patient presents for yearly preventative medicine examination. She is a pleasant 38 year old female who  has a past medical history of Gallstones (11/23/2015) and PONV (postoperative nausea and vomiting).  History of kidney stones - was seen by Urology in March 2023, had CT renal study done around this time which showed multiple non obstructing renal calculi with the largest measuring 4-6 mm in each kidney. She underwent ureteroscopy. Has not had any issues since   Vitamin D Def - not currently taking Vitamin D.   All immunizations and health maintenance protocols were reviewed with the patient and needed orders were placed. She is up to date on vaccinations.   Appropriate screening laboratory values were ordered for the patient including screening of hyperlipidemia, renal function and hepatic function.  Medication reconciliation,  past medical history, social history, problem list and allergies were reviewed in detail with the patient  Goals were established with regard to weight loss, exercise, and  diet in compliance with medications. She has switched from running to more weight training and walking.   Review of Systems  Constitutional: Negative.   HENT: Negative.    Eyes: Negative.   Respiratory: Negative.    Cardiovascular: Negative.   Gastrointestinal: Negative.   Endocrine: Negative.   Genitourinary: Negative.   Musculoskeletal: Negative.   Skin: Negative.   Allergic/Immunologic: Negative.   Neurological: Negative.   Hematological: Negative.   Psychiatric/Behavioral: Negative.     Past Medical History:  Diagnosis Date   Gallstones 11/23/2015   PONV (postoperative nausea and vomiting)     Social History   Socioeconomic History   Marital status: Married    Spouse name: Not on file   Number of children: Not on file   Years of education: Not on file    Highest education level: Master's degree (e.g., MA, MS, MEng, MEd, MSW, MBA)  Occupational History   Not on file  Tobacco Use   Smoking status: Never   Smokeless tobacco: Never  Vaping Use   Vaping status: Never Used  Substance and Sexual Activity   Alcohol use: Yes    Alcohol/week: 1.0 standard drink of alcohol    Types: 1 Standard drinks or equivalent per week   Drug use: No   Sexual activity: Yes    Birth control/protection: None  Other Topics Concern   Not on file  Social History Narrative   Works at [employer]    Three children    Social Drivers of Corporate investment banker Strain: Low Risk  (11/17/2023)   Overall Financial Resource Strain (CARDIA)    Difficulty of Paying Living Expenses: Not hard at all  Food Insecurity: No Food Insecurity (11/17/2023)   Hunger Vital Sign    Worried About Running Out of Food in the Last Year: Never true    Ran Out of Food in the Last Year: Never true  Transportation Needs: No Transportation Needs (11/17/2023)   PRAPARE - Administrator, Civil Service (Medical): No    Lack of Transportation (Non-Medical): No  Physical Activity: Sufficiently Active (11/17/2023)   Exercise Vital Sign    Days of Exercise per Week: 5 days    Minutes of Exercise per Session: 30 min  Stress: No Stress Concern Present (11/17/2023)   [employer] of Occupational Health - Occupational Stress Questionnaire    Feeling of  Stress : Not at all  Social Connections: Socially Integrated (11/17/2023)   Social Connection and Isolation Panel [NHANES]    Frequency of Communication with Friends and Family: Twice a week    Frequency of Social Gatherings with Friends and Family: Once a week    Attends Religious Services: 1 to 4 times per year    Active Member of [employer] or Organizations: Yes    Attends Banker Meetings: 1 to 4 times per year    Marital Status: Married  Catering manager Violence: Not on file    Past Surgical  History:  Procedure Laterality Date   CHOLECYSTECTOMY N/A 12/13/2015   Procedure: LAPAROSCOPIC CHOLECYSTECTOMY WITH INTRAOPERATIVE CHOLANGIOGRAM;  Surgeon: Avel Peace, MD;  Location: [facility] OR;  Service: General;  Laterality: N/A;   CYSTOSCOPY WITH RETROGRADE PYELOGRAM, URETEROSCOPY AND STENT PLACEMENT Bilateral 01/06/2023   Procedure: CYSTOSCOPY WITH RETROGRADE PYELOGRAM, URETEROSCOPY AND STENT PLACEMENT ON LEFT/DIAGNOSTIC URETEROSCOPY ON RIGHT;  Surgeon: Noel Christmas, MD;  Location: [facility];  Service: Urology;  Laterality: Bilateral;  1hr 45 mins   CYSTOSCOPY/URETEROSCOPY/HOLMIUM LASER/STENT PLACEMENT Right 01/20/2023   Procedure: CYSTOSCOPY, RIGHT URETEROSCOPY, HOLMIUM LASER, LITHOTRIPSY, RIGHT URETERAL STENT EXCHANGED;  Surgeon: Noel Christmas, MD;  Location: [facility];  Service: Urology;  Laterality: Right;   HOLMIUM LASER APPLICATION Bilateral 01/06/2023   Procedure: HOLMIUM LASER APPLICATION;  Surgeon: Noel Christmas, MD;  Location: [facility];  Service: Urology;  Laterality: Bilateral;   TONSILLECTOMY     as child    Family History  Problem Relation Age of Onset   Diabetes Mother    Diabetes Maternal Grandmother    Diabetes Maternal Grandfather     No Known Allergies  No current outpatient medications on file prior to visit.   No current facility-administered medications on file prior to visit.    BP 96/70   Pulse 77   Temp 98.1 F (36.7 C) (Oral)   Ht 5' 6.75" (1.695 m)   Wt 148 lb (67.1 kg)   LMP 11/16/2023   SpO2 100%   BMI 23.35 kg/m       Objective:   Physical Exam Vitals and nursing note reviewed.  Constitutional:      General: She is not in acute distress.    Appearance: Normal appearance. She is not ill-appearing.  HENT:     Head: Normocephalic and atraumatic.     Right Ear: Tympanic membrane, ear canal and external ear normal. There is no impacted cerumen.     Left Ear: Tympanic membrane,  ear canal and external ear normal. There is no impacted cerumen.     Nose: Nose normal. No congestion or rhinorrhea.     Mouth/Throat:     Mouth: Mucous membranes are moist.     Pharynx: Oropharynx is clear.  Eyes:     Extraocular Movements: Extraocular movements intact.     Conjunctiva/sclera: Conjunctivae normal.     Pupils: Pupils are equal, round, and reactive to light.  Neck:     Vascular: No carotid bruit.  Cardiovascular:     Rate and Rhythm: Normal rate and regular rhythm.     Pulses: Normal pulses.     Heart sounds: No murmur heard.    No friction rub. No gallop.  Pulmonary:     Effort: Pulmonary effort is normal.     Breath sounds: Normal breath sounds.  Abdominal:     General: Abdomen is flat. Bowel sounds are normal. There is no distension.  Palpations: Abdomen is soft. There is no mass.     Tenderness: There is no abdominal tenderness. There is no guarding or rebound.     Hernia: No hernia is present.  Musculoskeletal:        General: Normal range of motion.     Cervical back: Normal range of motion and neck supple.  Lymphadenopathy:     Cervical: No cervical adenopathy.  Skin:    General: Skin is warm and dry.     Capillary Refill: Capillary refill takes less than 2 seconds.  Neurological:     General: No focal deficit present.     Mental Status: She is alert and oriented to person, place, and time.  Psychiatric:        Mood and Affect: Mood normal.        Behavior: Behavior normal.        Thought Content: Thought content normal.        Judgment: Judgment normal.       Assessment & Plan:   1. Routine general medical examination at a health care facility (Primary) Today patient counseled on age appropriate routine health concerns for screening and prevention, each reviewed and up to date or declined. Immunizations reviewed and up to date or declined. Labs ordered and reviewed. Risk factors for depression reviewed and negative. Hearing function and visual  acuity are intact. ADLs screened and addressed as needed. Functional ability and level of safety reviewed and appropriate. Education, counseling and referrals performed based on assessed risks today. Patient provided with a copy of personalized plan for preventive services. - Benign exam - Follow up in one year or sooner if needed  2. Kidney stones - No reoccurrence.  - CBC with Differential/Platelet; Future - Comprehensive metabolic panel; Future - Lipid panel; Future - TSH; Future  3. Vitamin D deficiency  - CBC with Differential/Platelet; Future - Comprehensive metabolic panel; Future - Lipid panel; Future - TSH; Future - VITAMIN D 25 Hydroxy (Vit-D Deficiency, Fractures); Future  Shirline Frees, NP

## 2023-11-20 ENCOUNTER — Other Ambulatory Visit (INDEPENDENT_AMBULATORY_CARE_PROVIDER_SITE_OTHER): Payer: No Typology Code available for payment source

## 2023-11-20 ENCOUNTER — Encounter: Payer: Self-pay | Admitting: Adult Health

## 2023-11-20 DIAGNOSIS — N200 Calculus of kidney: Secondary | ICD-10-CM

## 2023-11-20 DIAGNOSIS — E559 Vitamin D deficiency, unspecified: Secondary | ICD-10-CM | POA: Diagnosis not present

## 2023-11-20 LAB — LIPID PANEL
Cholesterol: 188 mg/dL (ref 0–200)
HDL: 65.3 mg/dL (ref >39.00)
LDL Cholesterol: 109 mg/dL — ABNORMAL HIGH (ref 0–99)
NonHDL: 122.37
Total CHOL/HDL Ratio: 3
Triglycerides: 65 mg/dL (ref 0.0–149.0)
VLDL: 13 mg/dL (ref 0.0–40.0)

## 2023-11-20 LAB — COMPREHENSIVE METABOLIC PANEL
ALT: 8 U/L (ref 0–35)
AST: 15 U/L (ref 0–37)
Albumin: 4.7 g/dL (ref 3.5–5.2)
Alkaline Phosphatase: 51 U/L (ref 39–117)
BUN: 15 mg/dL (ref 6–23)
CO2: 28 meq/L (ref 19–32)
Calcium: 9.3 mg/dL (ref 8.4–10.5)
Chloride: 103 meq/L (ref 96–112)
Creatinine, Ser: 0.79 mg/dL (ref 0.40–1.20)
GFR: 95.58 mL/min (ref >60.00)
Glucose, Bld: 92 mg/dL (ref 70–99)
Potassium: 4.2 meq/L (ref 3.5–5.1)
Sodium: 138 meq/L (ref 135–145)
Total Bilirubin: 0.6 mg/dL (ref 0.2–1.2)
Total Protein: 7.3 g/dL (ref 6.0–8.3)

## 2023-11-20 LAB — VITAMIN D 25 HYDROXY (VIT D DEFICIENCY, FRACTURES): VITD: 28.67 ng/mL — ABNORMAL LOW (ref 30.00–100.00)

## 2023-11-20 LAB — CBC WITH DIFFERENTIAL/PLATELET
Basophils Absolute: 0 10*3/uL (ref 0.0–0.1)
Basophils Relative: 0.5 % (ref 0.0–3.0)
Eosinophils Absolute: 0.2 10*3/uL (ref 0.0–0.7)
Eosinophils Relative: 3.8 % (ref 0.0–5.0)
HCT: 41.3 % (ref 36.0–46.0)
Hemoglobin: 13.4 g/dL (ref 12.0–15.0)
Lymphocytes Relative: 32.7 % (ref 12.0–46.0)
Lymphs Abs: 1.7 10*3/uL (ref 0.7–4.0)
MCHC: 32.5 g/dL (ref 30.0–36.0)
MCV: 86.3 fL (ref 78.0–100.0)
Monocytes Absolute: 0.5 10*3/uL (ref 0.1–1.0)
Monocytes Relative: 10 % (ref 3.0–12.0)
Neutro Abs: 2.8 10*3/uL (ref 1.4–7.7)
Neutrophils Relative %: 53 % (ref 43.0–77.0)
Platelets: 307 10*3/uL (ref 150.0–400.0)
RBC: 4.79 Mil/uL (ref 3.87–5.11)
RDW: 15.2 % (ref 11.5–15.5)
WBC: 5.3 10*3/uL (ref 4.0–10.5)

## 2023-11-20 LAB — TSH: TSH: 2.6 u[IU]/mL (ref 0.35–5.50)

## 2023-11-24 NOTE — Telephone Encounter (Signed)
 FYI

## 2024-07-04 ENCOUNTER — Ambulatory Visit: Admitting: Family Medicine

## 2024-07-04 ENCOUNTER — Ambulatory Visit: Payer: Self-pay | Admitting: *Deleted

## 2024-07-04 ENCOUNTER — Encounter: Payer: Self-pay | Admitting: Family Medicine

## 2024-07-04 VITALS — BP 106/66 | HR 53 | Temp 98.2°F | Wt 146.1 lb

## 2024-07-04 DIAGNOSIS — R208 Other disturbances of skin sensation: Secondary | ICD-10-CM | POA: Diagnosis not present

## 2024-07-04 DIAGNOSIS — R5383 Other fatigue: Secondary | ICD-10-CM

## 2024-07-04 NOTE — Telephone Encounter (Signed)
 Patient requesting to be seen today by any provider due to spreading itching and burning sensation from back , neck and face.      FYI Only or Action Required?: FYI only for provider.  Patient was last seen in primary care on 11/18/2023 by Merna Huxley, NP.  Called Nurse Triage reporting Fatigue. Itching, burning sensation spreading on skin, no rash  Symptoms began a week ago.  Interventions attempted: Rest, hydration, or home remedies.  Symptoms are: gradually worsening.  Triage Disposition: See PCP When Office is Open (Within 3 Days)  Patient/caregiver understands and will follow disposition?: Yes               Copied from CRM [id number]. Topic: Clinical - Red Word Triage >> Jul 04, 2024  8:03 AM Timindy P wrote: [facility] that prompted transfer to Nurse Triage: Extreme fatigue, itchiness spreading to neck and face (no rash) Reason for Disposition  [1] MILD weakness (e.g., does not interfere with ability to work, go to school, normal activities) AND [2] persists > 1 week  Answer Assessment - Initial Assessment Questions Appt scheduled today with other provider due to worsening sx. Fatigue 3 days. Itching and burning sensation on skin spreading. Skin warm to touch. No fever. Patient requesting earlier appt today and none available with PCP     1. DESCRIPTION: Describe how you are feeling.     Exhausted and tired  2. SEVERITY: How bad is it?  Can you stand and walk?     Can walk 3. ONSET: When did these symptoms begin? (e.g., hours, days, weeks, months)     3 days  4. CAUSE: What do you think is causing the weakness or fatigue? (e.g., not drinking enough fluids, medical problem, trouble sleeping)     No other reason  5. NEW MEDICINES:  Have you started on any new medicines recently? (e.g., opioid pain medicines, benzodiazepines, muscle relaxants, antidepressants, antihistamines, neuroleptics, beta blockers)     No  6. OTHER SYMPTOMS: Do you have any  other symptoms? (e.g., chest pain, fever, cough, SOB, vomiting, diarrhea, bleeding, other areas of pain)     Left side of abdomen /flank discomfort. Not constant. Sensation on burning itching to back , left side face ,ear, neck. Skin sensation sensitive to touch, skin warm to touch  7. PREGNANCY: Is there any chance you are pregnant? When was your last menstrual period?     na  Protocols used: Weakness (Generalized) and Fatigue-A-AH

## 2024-07-04 NOTE — Progress Notes (Signed)
 Established Patient Office Visit  Subjective   Patient ID: Dana Anderson, female    DOB: 09/14/1986  Age: 38 y.o. MRN: [id number]  Chief Complaint  Patient presents with   Fatigue   Anal Itching    HPI   Dana Anderson is seen today as a work in with predominantly burning dysesthesias involving both sides of the body including chest, upper back, neck, lower chin region.  She has also noticed some increased fatigue over the past several days.  Symptoms started sometime last week with the dysesthesias.  She took some Benadryl  without any significant change in symptoms.  No associated rash.  She generally exercises regularly and even with getting 10 hours sleep has felt more fatigued recently.  She had full labs in February including thyroid  function which were normal.  She feels like her symptoms are slightly worse with exercise.  No urticaria or any other rashes whatsoever.  No focal weakness.  No visual changes.  Denies any recent change of soaps or detergents.  Not using any topical lotions.  No recent fever or respiratory symptoms.  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  Her burning sensation which is again bilateral is relatively mild and 2 out of 10 intensity.  Very mild itching but predominantly burning sensation.  Does not take any regular oral medications.  No recent discontinuation of medications.  Appetite and weight stable  Past Medical History:  Diagnosis Date   Gallstones 11/23/2015   PONV (postoperative nausea and vomiting)    Past Surgical History:  Procedure Laterality Date   CHOLECYSTECTOMY N/A 12/13/2015   Procedure: LAPAROSCOPIC CHOLECYSTECTOMY WITH INTRAOPERATIVE CHOLANGIOGRAM;  Surgeon: Krystal Russell, MD;  Location: [facility] OR;  Service: General;  Laterality: N/A;   CYSTOSCOPY WITH RETROGRADE PYELOGRAM, URETEROSCOPY AND STENT PLACEMENT Bilateral 01/06/2023   Procedure: CYSTOSCOPY WITH RETROGRADE  PYELOGRAM, URETEROSCOPY AND STENT PLACEMENT ON LEFT/DIAGNOSTIC URETEROSCOPY ON RIGHT;  Surgeon: Elisabeth Valli BIRCH, MD;  Location: [facility];  Service: Urology;  Laterality: Bilateral;  1hr 45 mins   CYSTOSCOPY/URETEROSCOPY/HOLMIUM LASER/STENT PLACEMENT Right 01/20/2023   Procedure: CYSTOSCOPY, RIGHT URETEROSCOPY, HOLMIUM LASER, LITHOTRIPSY, RIGHT URETERAL STENT EXCHANGED;  Surgeon: Elisabeth Valli BIRCH, MD;  Location: [facility];  Service: Urology;  Laterality: Right;   HOLMIUM LASER APPLICATION Bilateral 01/06/2023   Procedure: HOLMIUM LASER APPLICATION;  Surgeon: Elisabeth Valli BIRCH, MD;  Location: [facility];  Service: Urology;  Laterality: Bilateral;   TONSILLECTOMY     as child    reports that she has never smoked. She has never used smokeless tobacco. She reports current alcohol use of about 1.0 standard drink of alcohol per week. She reports that she does not use drugs. family history includes Diabetes in her maternal grandfather, maternal grandmother, and mother. No Known Allergies  Review of Systems  Constitutional:  Negative for chills, fever and weight loss.  HENT:  Negative for congestion and sore throat.   Eyes:  Negative for blurred vision and double vision.  Respiratory:  Negative for shortness of breath.   Cardiovascular:  Negative for chest pain.  Gastrointestinal:  Negative for abdominal pain, nausea and vomiting.  Genitourinary:  Negative for dysuria.  Skin:  Negative for rash.  Neurological:  Positive for sensory change. Negative for dizziness, tremors, speech change, focal weakness and headaches.      Objective:     BP 106/66   Pulse (!) 53   Temp 98.2 F (36.8 C) (Oral)  Wt 146 lb 1.6 oz (66.3 kg)   SpO2 98%   BMI 23.05 kg/m  BP Readings from Last 3 Encounters:  07/04/24 106/66  11/18/23 110/70  07/02/23 104/70   Wt Readings from Last 3 Encounters:  07/04/24 146 lb 1.6 oz (66.3 kg)  11/18/23 148 lb (67.1 kg)   07/02/23 143 lb (64.9 kg)      Physical Exam Vitals reviewed.  Constitutional:      General: She is not in acute distress.    Appearance: She is not ill-appearing.  Cardiovascular:     Rate and Rhythm: Normal rate and regular rhythm.     Heart sounds: No murmur heard. Pulmonary:     Effort: Pulmonary effort is normal.     Breath sounds: Normal breath sounds. No wheezing or rales.  Musculoskeletal:     Cervical back: Neck supple.  Lymphadenopathy:     Cervical: No cervical adenopathy.  Skin:    Findings: No bruising or rash.  Neurological:     General: No focal deficit present.     Mental Status: She is alert and oriented to person, place, and time.     Cranial Nerves: No cranial nerve deficit.     Motor: No weakness.      No results found for any visits on 07/04/24.  Last CBC Lab Results  Component Value Date   WBC 5.3 11/20/2023   HGB 13.4 11/20/2023   HCT 41.3 11/20/2023   MCV 86.3 11/20/2023   MCH 29.9 02/28/2019   RDW 15.2 11/20/2023   PLT 307.0 11/20/2023   Last metabolic panel Lab Results  Component Value Date   GLUCOSE 92 11/20/2023   NA 138 11/20/2023   K 4.2 11/20/2023   CL 103 11/20/2023   CO2 28 11/20/2023   BUN 15 11/20/2023   CREATININE 0.79 11/20/2023   GFR 95.58 11/20/2023   CALCIUM 9.3 11/20/2023   PROT 7.3 11/20/2023   ALBUMIN 4.7 11/20/2023   BILITOT 0.6 11/20/2023   ALKPHOS 51 11/20/2023   AST 15 11/20/2023   ALT 8 11/20/2023   ANIONGAP 12 12/11/2015   Last hemoglobin A1c No results found for: HGBA1C Last thyroid  functions Lab Results  Component Value Date   TSH 2.60 11/20/2023      The ASCVD Risk score (Arnett DK, et al., 2019) failed to calculate for the following reasons:   The 2019 ASCVD risk score is only valid for ages 72 to 68    Assessment & Plan:   Problem List Items Addressed This Visit   None Visit Diagnoses       Dysesthesia affecting both sides of body    -  Primary   Relevant Orders   Vitamin B12    CBC with Differential/Platelet   CMP     Patient presents with approximately 5-day history of mild burning dysesthesia involving chest, upper back, neck, chin region bilaterally.  No associated rash.  She has had past history of borderline low B12 couple years ago.  Etiology unclear.  Does not have any unilateral involvement or rash to suggest shingles.  Differential is broad for dysesthesias and includes small fiber neuropathy, autoimmune, amyloidosis, MS, and others.  She does not have any known history of Lyme disease.  Only infrequent alcohol use.  -Recommend starting with repeat B12, CBC, CMP.  If above labs completely normal and symptoms persist consider neurology consultation.  Did not repeat thyroid  today since this was done with labs back in February and normal.  No follow-ups  on file.    Wolm Scarlet, MD

## 2024-07-05 ENCOUNTER — Ambulatory Visit: Payer: Self-pay | Admitting: Family Medicine

## 2024-07-05 LAB — COMPREHENSIVE METABOLIC PANEL WITH GFR
ALT: 9 U/L (ref 0–35)
AST: 15 U/L (ref 0–37)
Albumin: 4.7 g/dL (ref 3.5–5.2)
Alkaline Phosphatase: 50 U/L (ref 39–117)
BUN: 11 mg/dL (ref 6–23)
CO2: 28 meq/L (ref 19–32)
Calcium: 9.6 mg/dL (ref 8.4–10.5)
Chloride: 103 meq/L (ref 96–112)
Creatinine, Ser: 0.71 mg/dL (ref 0.40–1.20)
GFR: 108.17 mL/min (ref >60.00)
Glucose, Bld: 66 mg/dL — ABNORMAL LOW (ref 70–99)
Potassium: 4 meq/L (ref 3.5–5.1)
Sodium: 139 meq/L (ref 135–145)
Total Bilirubin: 0.5 mg/dL (ref 0.2–1.2)
Total Protein: 7.2 g/dL (ref 6.0–8.3)

## 2024-07-05 LAB — CBC WITH DIFFERENTIAL/PLATELET
Basophils Absolute: 0.1 K/uL (ref 0.0–0.1)
Basophils Relative: 1.1 % (ref 0.0–3.0)
Eosinophils Absolute: 0.2 K/uL (ref 0.0–0.7)
Eosinophils Relative: 1.7 % (ref 0.0–5.0)
HCT: 40 % (ref 36.0–46.0)
Hemoglobin: 13.2 g/dL (ref 12.0–15.0)
Lymphocytes Relative: 24 % (ref 12.0–46.0)
Lymphs Abs: 2.2 K/uL (ref 0.7–4.0)
MCHC: 33.1 g/dL (ref 30.0–36.0)
MCV: 85.9 fl (ref 78.0–100.0)
Monocytes Absolute: 0.8 K/uL (ref 0.1–1.0)
Monocytes Relative: 8.4 % (ref 3.0–12.0)
Neutro Abs: 5.9 K/uL (ref 1.4–7.7)
Neutrophils Relative %: 64.8 % (ref 43.0–77.0)
Platelets: 333 K/uL (ref 150.0–400.0)
RBC: 4.66 Mil/uL (ref 3.87–5.11)
RDW: 14 % (ref 11.5–15.5)
WBC: 9.1 K/uL (ref 4.0–10.5)

## 2024-07-05 LAB — VITAMIN B12: Vitamin B-12: 228 pg/mL (ref 211–911)

## 2024-07-05 NOTE — Telephone Encounter (Signed)
 Noted

## 2024-07-06 ENCOUNTER — Ambulatory Visit: Admitting: Adult Health

## 2024-08-29 ENCOUNTER — Encounter (HOSPITAL_COMMUNITY): Payer: Self-pay | Admitting: Obstetrics and Gynecology

## 2024-11-23 ENCOUNTER — Encounter: Admitting: Adult Health
